# Patient Record
Sex: Female | Born: 1990
Health system: Southern US, Community
[De-identification: ages and names within clinical notes are randomized; demographics above are authoritative.]

## PROBLEM LIST (undated history)

## (undated) DIAGNOSIS — F329 Major depressive disorder, single episode, unspecified: Secondary | ICD-10-CM

## (undated) DIAGNOSIS — F32A Depression, unspecified: Secondary | ICD-10-CM

## (undated) DIAGNOSIS — L309 Dermatitis, unspecified: Secondary | ICD-10-CM

## (undated) HISTORY — DX: Depression, unspecified: F32.A

## (undated) HISTORY — DX: Dermatitis, unspecified: L30.9

---
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
## 2017-01-24 ENCOUNTER — Ambulatory Visit (INDEPENDENT_AMBULATORY_CARE_PROVIDER_SITE_OTHER): Payer: BLUE CROSS/BLUE SHIELD | Admitting: Family Medicine

## 2017-01-24 VITALS — BP 112/64 | HR 60 | Temp 98.6°F | Resp 16 | Ht 68.5 in | Wt 124.0 lb

## 2017-01-24 DIAGNOSIS — Z30015 Encounter for initial prescription of vaginal ring hormonal contraceptive: Secondary | ICD-10-CM | POA: Diagnosis not present

## 2017-01-24 DIAGNOSIS — Z124 Encounter for screening for malignant neoplasm of cervix: Secondary | ICD-10-CM | POA: Diagnosis not present

## 2017-01-24 DIAGNOSIS — Z113 Encounter for screening for infections with a predominantly sexual mode of transmission: Secondary | ICD-10-CM | POA: Diagnosis not present

## 2017-01-24 DIAGNOSIS — M799 Soft tissue disorder, unspecified: Secondary | ICD-10-CM

## 2017-01-24 DIAGNOSIS — Z0000 Encounter for general adult medical examination without abnormal findings: Secondary | ICD-10-CM

## 2017-01-24 DIAGNOSIS — M7989 Other specified soft tissue disorders: Secondary | ICD-10-CM

## 2017-01-24 LAB — POCT URINE PREGNANCY: PREG TEST UR: NEGATIVE

## 2017-01-24 MED ORDER — ETONOGESTREL-ETHINYL ESTRADIOL 0.12-0.015 MG/24HR VA RING: VAGINAL_RING | VAGINAL | 12 refills | Status: DC

## 2017-01-24 NOTE — Progress Notes (Signed)
Chief Complaint  Patient presents with  . Annual Exam    w/PAP    Subjective:  Pamela Gallegos is a 26 y.o. femalRichmond Campbelle here for a health maintenance visit.  Patient is new pt  Contraception: Pt uses condoms States that she has sex on January 13, 2017 She took a plan B pill within 24 hours Took an iud in the past and had uterine pain Took pills inconsistently  There are no active problems to display for this patient.   Past Medical History:  Diagnosis Date  . Eczema     No past surgical history on file.   No outpatient prescriptions prior to visit.   No facility-administered medications prior to visit.     Allergies  Allergen Reactions  . Penicillins      Family History  Problem Relation Age of Onset  . Hyperlipidemia Mother   . Hypertension Mother      Health Habits: Dental Exam: not up to date Eye Exam: not up to date Exercise: 7 times/week on average Current exercise activities: walking/running Diet: balanced  Social History   Social History  . Marital status: Single    Spouse name: N/A  . Number of children: N/A  . Years of education: N/A   Occupational History  . Not on file.   Social History Main Topics  . Smoking status: Never Smoker  . Smokeless tobacco: Never Used  . Alcohol use Not on file  . Drug use: Unknown  . Sexual activity: Not on file   Other Topics Concern  . Not on file   Social History Narrative  . No narrative on file   History  Alcohol use Not on file   History  Smoking Status  . Never Smoker  Smokeless Tobacco  . Never Used   History  Drug use: Unknown    GYN: Sexual Health Menstrual status: regular menses LMP: Patient's last menstrual period was 12/28/2016 (exact date). Last pap smear: see HM section History of abnormal pap smears:  Sexually active: yes with female partner Current contraception: condoms and withdrawal method  Health Maintenance: See under health Maintenance activity for review of  completion dates as well.  There is no immunization history on file for this patient.    Depression Screen-PHQ2/9 [facility] screen [facility] 01/[facility]/2017  Decreased Interest 0  Down, Depressed, Hopeless 0  PHQ - 2 Score 0      Depression Severity and Treatment Recommendations:  0-4= None  5-9= Mild / Treatment: Support, educate to call if worse; return in one month  10-14= Moderate / Treatment: Support, watchful waiting; Antidepressant or Psycotherapy  15-19= Moderately severe / Treatment: Antidepressant OR Psychotherapy  >= 20 = Major depression, severe / Antidepressant AND Psychotherapy    Review of Systems   Review of Systems  Constitutional: Negative for chills, fever and weight loss.  HENT: Negative for congestion, hearing loss and sinus pain.   Eyes: Negative for blurred vision and double vision.  Respiratory: Negative for cough, shortness of breath and wheezing.   Cardiovascular: Negative for chest pain, palpitations and leg swelling.  Gastrointestinal: Negative for abdominal pain, diarrhea, nausea and vomiting.  Genitourinary: Negative for dysuria, frequency and urgency.  Musculoskeletal: Negative for back pain, joint pain and myalgias.  Skin: Negative for itching and rash.  Neurological: Negative for dizziness, tingling, tremors, sensory change, speech change and headaches.  Psychiatric/Behavioral: Negative for depression. The patient is not nervous/anxious and does not have insomnia.     See HPI for  ROS as well.    Objective:   Vitals:   01/24/17 0857  BP: 112/64  Pulse: 60  Resp: 16  Temp: 98.6 F (37 C)  TempSrc: Oral  SpO2: 100%  Weight: 124 lb (56.2 kg)  Height: 5' 8.5" (1.74 m)    Body mass index is 18.58 kg/m.  Physical Exam  Constitutional: She is oriented to person, place, and time. She appears well-developed and well-nourished.  HENT:  Head: Normocephalic and atraumatic.  Right Ear: External ear normal.  Left Ear: External ear normal.   Nose: Nose normal.  Mouth/Throat: Oropharynx is clear and moist.  Eyes: Conjunctivae and EOM are normal. Pupils are equal, round, and reactive to light.  Neck: Normal range of motion. Neck supple.  Cardiovascular: Normal rate, regular rhythm, normal heart sounds and intact distal pulses.   No murmur heard. Pulmonary/Chest: Effort normal and breath sounds normal. No respiratory distress. She has no wheezes.  Abdominal: Soft. Bowel sounds are normal. She exhibits no distension. There is no tenderness.  Genitourinary: There is no tenderness or lesion on the right labia. There is no tenderness or lesion on the left labia. No erythema, tenderness or bleeding in the vagina.  Genitourinary Comments: Mons pubis showing a small lesion with purulent drainage  Musculoskeletal: Normal range of motion. She exhibits no edema.  Neurological: She is alert and oriented to person, place, and time. No cranial nerve deficit.  Skin: Skin is warm. No erythema.  Psychiatric: She has a normal mood and affect. Her behavior is normal. Judgment and thought content normal.     Assessment/Plan:   Patient was seen for a health maintenance exam.  Counseled the patient on health maintenance issues. Reviewed her health mainteance schedule and ordered appropriate tests (see orders.) Counseled on regular exercise and weight management. Recommend regular eye exams and dental cleaning.   The following issues were addressed today for health maintenance:   Pamela Gallegos was seen today for annual exam.  Diagnoses and all orders for this visit:  Health maintenance examination- discussed age appropriate screenings  Screen for STD (sexually transmitted disease)- discussed screenings -     Pap IG, CT/NG w/ reflex HPV when ASC-U -     RPR -     HIV antibody -     Hepatitis B surface antibody -     Herpes simplex virus culture  Pap smear for cervical cancer screening- patient's first pap Discussed screening for cervical  cancer and high risk HPV -     Pap IG, CT/NG w/ reflex HPV when ASC-U  Encounter for initial prescription of vaginal ring hormonal contraceptive -     POCT urine pregnancy -     etonogestrel-ethinyl estradiol (NUVARING) 0.12-0.015 MG/24HR vaginal ring; Insert vaginally and leave in place for 3 consecutive weeks, then remove for 1 week.  Soft tissue lesion of pelvic region- will check for hsv -     Herpes simplex virus culture  Other orders    No Follow-up on file.    Body mass index is 18.58 kg/m.:  Discussed the patient's BMI with patient. The BMI body mass index is 18.58 kg/m.     No future appointments.  There are no Patient Instructions on file for this visit.

## 2017-01-25 LAB — HIV ANTIBODY (ROUTINE TESTING W REFLEX): HIV SCREEN 4TH GENERATION: NONREACTIVE

## 2017-01-25 LAB — RPR: RPR Ser Ql: NONREACTIVE

## 2017-01-25 LAB — HEPATITIS B SURFACE ANTIBODY, QUANTITATIVE: HEPATITIS B SURF AB QUANT: 404.8 m[IU]/mL

## 2017-01-26 LAB — HERPES SIMPLEX VIRUS CULTURE

## 2017-01-27 LAB — PAP IG, CT-NG, RFX HPV ASCU
CHLAMYDIA, NUC. ACID AMP: NEGATIVE
GONOCOCCUS BY NUCLEIC ACID AMP: NEGATIVE
PAP Smear Comment: 0

## 2017-02-19 ENCOUNTER — Ambulatory Visit (INDEPENDENT_AMBULATORY_CARE_PROVIDER_SITE_OTHER): Payer: BLUE CROSS/BLUE SHIELD | Admitting: Urgent Care

## 2017-02-19 ENCOUNTER — Ambulatory Visit (INDEPENDENT_AMBULATORY_CARE_PROVIDER_SITE_OTHER): Payer: BLUE CROSS/BLUE SHIELD

## 2017-02-19 VITALS — BP 100/63 | HR 61 | Temp 98.5°F | Resp 17 | Ht 68.5 in | Wt 128.0 lb

## 2017-02-19 DIAGNOSIS — M79641 Pain in right hand: Secondary | ICD-10-CM

## 2017-02-19 DIAGNOSIS — M25531 Pain in right wrist: Secondary | ICD-10-CM | POA: Diagnosis not present

## 2017-02-19 MED ORDER — NAPROXEN SODIUM 550 MG PO TABS: 550.0000 mg | ORAL_TABLET | Freq: Two times a day (BID) | ORAL | 1 refills | Status: DC

## 2017-02-19 NOTE — Patient Instructions (Addendum)
Wrist Pain, Adult There are many things that can cause wrist pain. Some common causes include:  An injury to the wrist area, such as a sprain, strain, or fracture.  Overuse of the joint.  A condition that causes increased pressure on a nerve in the wrist (carpal tunnel syndrome).  Wear and tear of the joints that occurs with aging (osteoarthritis).  A variety of other types of arthritis. Sometimes, the cause of wrist pain is not known. Often, the pain goes away when you follow instructions from your health care provider for relieving pain at home, such as resting or icing the wrist. If your wrist pain continues, it is important to tell your health care provider. Follow these instructions at home:  Rest the wrist area for at least 48 hours or as long as told by your health care provider.  If a splint or elastic bandage has been applied, use it as told by your health care provider.  Remove the splint or bandage only as told by your health care provider.  Loosen the splint or bandage if your fingers tingle, become numb, or turn cold or blue.  If directed, apply ice to the injured area.  If you have a removable splint or elastic bandage, remove it as told by your health care provider.  Put ice in a plastic bag.  Place a towel between your skin and the bag or between your splint or bandage and the bag.  Leave the ice on for 20 minutes, 2-3 times a day.  Keep your arm raised (elevated) above the level of your heart while you are sitting or lying down.  Take over-the-counter and prescription medicines only as told by your health care provider.  Keep all follow-up visits as told by your health care provider. This is important. Contact a health care provider if:  You have a sudden sharp pain in the wrist, hand, or arm that is different or new.  The swelling or bruising on your wrist or hand gets worse.  Your skin becomes red, gets a rash, or has open sores.  Your pain does not  get better or it gets worse. Get help right away if:  You lose feeling in your fingers or hand.  Your fingers turn white, very red, or cold and blue.  You cannot move your fingers.  You have a fever or chills. This information is not intended to replace advice given to you by your health care provider. Make sure you discuss any questions you have with your health care provider. Document Released: 09/11/2005 Document Revised: 06/27/2016 Document Reviewed: 06/20/2016 Elsevier Interactive Patient Education  2017 [employer]     IF you received an x-ray today, you will receive an invoice from [facility]. Please contact [facility] at [phone] with questions or concerns regarding your invoice.   IF you received labwork today, you will receive an invoice from [facility]. Please contact [facility] at [phone] with questions or concerns regarding your invoice.   Our billing staff will not be able to assist you with questions regarding bills from these companies.  You will be contacted with the lab results as soon as they are available. The fastest way to get your results is to activate your My Chart account. Instructions are located on the last page of this paperwork. If you have not heard from us regarding the results in 2 weeks, please contact this office.

## 2017-02-19 NOTE — Progress Notes (Signed)
MRN: [id number] DOB: [birth year]  Subjective:   Pamela Gallegos is a 26 y.o. female presenting for chief complaint of Wrist Pain  Reports 3 day history of right wrist and hand pain. Pain is over ventral aspect of wrist, dorsal aspect of hand with associated numbness or tingling. She also admits pain at knuckles and swelling of her fingers. 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 Denies trauma, weakness, decreased ROM. Denies personal history of RA.  Lilly has a current medication list which includes the following prescription(s): etonogestrel-ethinyl estradiol, ibuprofen, and triamcinolone. Also is allergic to penicillins. Aylana  has a past medical history of Eczema. Also denies past surgical history.  Objective:   Vitals: BP 100/63   Pulse 61   Temp 98.5 F (36.9 C) (Oral)   Resp 17   Ht 5' 8.5" (1.74 m)   Wt 128 lb (58.1 kg)   LMP 01/31/2017 (Approximate)   SpO2 97%   BMI 19.18 kg/m   Physical Exam  Constitutional: She is oriented to person, place, and time. She appears well-developed and well-nourished.  Cardiovascular: Normal rate.   Pulmonary/Chest: Effort normal.  Musculoskeletal:       Right wrist: She exhibits normal range of motion, no tenderness, no bony tenderness, no swelling, no effusion, no crepitus, no deformity and no laceration.       Right hand: She exhibits swelling (trace over 2nd-3rd fingers). She exhibits normal range of motion, no tenderness, no bony tenderness, normal two-point discrimination, normal capillary refill, no deformity and no laceration. Normal sensation noted. Normal strength noted.  Neurological: She is alert and oriented to person, place, and time.  Skin: Skin is warm and dry.   No results found.  Assessment and Plan :   1. Right wrist pain 2. Right hand pain - Likely due to inflammatory process from overuse/nature of her work. Start  conservative management, start wearing wrist splint, use Anaprox. RTC in 2 weeks if no improvement. Follow up with labs.  Wallis Bamberg, PA-C Primary Care at [facility] [phone] 02/19/2017  5:43 PM

## 2017-02-20 ENCOUNTER — Telehealth: Payer: Self-pay | Admitting: Urgent Care

## 2017-02-20 LAB — SEDIMENTATION RATE: SED RATE: 5 mm/h (ref 0–32)

## 2017-02-20 LAB — CBC
Hematocrit: 37.6 % (ref 34.0–46.6)
Hemoglobin: 12.5 g/dL (ref 11.1–15.9)
MCH: 31.3 pg (ref 26.6–33.0)
MCHC: 33.2 g/dL (ref 31.5–35.7)
MCV: 94 fL (ref 79–97)
PLATELETS: 394 10*3/uL — AB (ref 150–379)
RBC: 3.99 x10E6/uL (ref 3.77–5.28)
RDW: 14.1 % (ref 12.3–15.4)
WBC: 3.8 10*3/uL (ref 3.4–10.8)

## 2017-02-20 NOTE — Telephone Encounter (Signed)
Pt was seen yesterday for wrist pain. Labs and x-rays were done and pt says results came back normal. She was wondering if she should still take the naproxen sodium that was prescribed to her. Please advise. Best callback number is [phone]. Pt said it is okay to leave a voicemail.

## 2017-02-20 NOTE — Telephone Encounter (Signed)
She may continue to take, correct?

## 2017-02-20 NOTE — Telephone Encounter (Signed)
Yes, she should take these medications. Please also let her know that her blood cell counts were normal and that her marker for inflammation was also very normal. Thank you!

## 2017-02-20 NOTE — Telephone Encounter (Signed)
Pt advised.

## 2017-02-21 ENCOUNTER — Telehealth: Payer: Self-pay | Admitting: Emergency Medicine

## 2017-02-21 NOTE — Telephone Encounter (Signed)
Spoke with pt gave her results of Xray pt understand results will f/u in no better

## 2017-02-21 NOTE — Telephone Encounter (Signed)
-----   Message from Pamela BambergMario Mani, [place]-C sent at 02/20/2017  8:17 AM EST ----- Please report to patient that her x-rays were normal. Her wrist and hand pain is likely due to overuse like we discussed in clinic. She is to follow our recommendations, take medication that we prescribed and return to clinic in 2 weeks if no improvement.

## 2017-03-05 ENCOUNTER — Encounter: Payer: Self-pay | Admitting: Family Medicine

## 2017-06-11 ENCOUNTER — Encounter: Payer: Self-pay | Admitting: Family Medicine

## 2017-06-11 ENCOUNTER — Ambulatory Visit (INDEPENDENT_AMBULATORY_CARE_PROVIDER_SITE_OTHER): Payer: BLUE CROSS/BLUE SHIELD | Admitting: Family Medicine

## 2017-06-11 VITALS — BP 107/68 | HR 77 | Temp 98.2°F | Resp 16 | Ht 68.9 in | Wt 126.4 lb

## 2017-06-11 DIAGNOSIS — N898 Other specified noninflammatory disorders of vagina: Secondary | ICD-10-CM | POA: Diagnosis not present

## 2017-06-11 NOTE — Progress Notes (Signed)
  Chief Complaint  Patient presents with  . labia minora    small raised bump on labia, onset: 06/07/17, painful with wiping    HPI   Pt repoprts a small lesion on her right labia minora that she noticed a few days ago in the shower It was found incidentally but was painful when she touched it or wiped No bleeding She reports that it resolved without intervention  Past Medical History:  Diagnosis Date  . Eczema     Current Outpatient Prescriptions  Medication Sig Dispense Refill  . etonogestrel-ethinyl estradiol (NUVARING) 0.12-0.015 MG/24HR vaginal ring Insert vaginally and leave in place for 3 consecutive weeks, then remove for 1 week. 1 each 12  . Ibuprofen (ADVIL PO) Take by mouth.    . triamcinolone (KENALOG) 0.025 % cream Apply 1 application topically as needed.    . naproxen sodium (ANAPROX DS) 550 MG tablet Take 1 tablet (550 mg total) by mouth 2 (two) times daily with a meal. (Patient not taking: Reported on 06/11/2017) 30 tablet 1   No current facility-administered medications for this visit.     Allergies:  Allergies  Allergen Reactions  . Penicillins     History reviewed. No pertinent surgical history.  Social History   Social History  . Marital status: Single    Spouse name: N/A  . Number of children: N/A  . Years of education: N/A   Social History Main Topics  . Smoking status: Never Smoker  . Smokeless tobacco: Never Used  . Alcohol use No  . Drug use: No  . Sexual activity: No   Other Topics Concern  . None   Social History Narrative  . None    ROS No fevers or chills She hpi  Objective: Vitals:   06/11/17 1128  BP: 107/68  Pulse: 77  Resp: 16  Temp: 98.2 F (36.8 C)  TempSrc: Oral  SpO2: 99%  Weight: 126 lb 6.4 oz (57.3 kg)  Height: 5' 8.9" (1.75 m)    Physical Exam  Constitutional: She is oriented to person, place, and time. She appears well-developed and well-nourished.  HENT:  Head: Normocephalic and atraumatic.    Neurological: She is alert and oriented to person, place, and time.   Vaginal exam Chaperone present Labia normal bilaterally without skin lesions Urethral meatus normal appearing without erythema     Assessment and Plan Zamaria was seen today for labia minora.  Diagnoses and all orders for this visit:  Vaginal lesion  Resolved Discussed different types of lesions Based on location it was probably a blocked duct that formed a small cyst   Jamiria Langill A Creta LevinStallings

## 2017-06-11 NOTE — Patient Instructions (Addendum)
IF you received an x-ray today, you will receive an invoice from [facility]. Please contact [facility] at [phone] with questions or concerns regarding your invoice.   IF you received labwork today, you will receive an invoice from [facility]. Please contact [facility] at [phone] with questions or concerns regarding your invoice.   Our billing staff will not be able to assist you with questions regarding bills from these companies.  You will be contacted with the lab results as soon as they are available. The fastest way to get your results is to activate your My Chart account. Instructions are located on the last page of this paperwork. If you have not heard from us regarding the results in 2 weeks, please contact this office.     Bartholin Cyst or Abscess A Bartholin cyst is a fluid-filled sac that forms on a Bartholin gland. Bartholin glands are small glands that are located within the folds of skin (labia) along the sides of the lower opening of the vagina. These glands produce a fluid to moisten the outside of the vagina during sexual intercourse. A Bartholin cyst causes a bulge on the side of the vagina. A cyst that is not large or infected may not cause symptoms or problems. However, if the fluid within the cyst becomes infected, the cyst can turn into an abscess. An abscess may cause discomfort or pain. What are the causes? A Bartholin cyst may develop when the duct of the gland becomes blocked. In many cases, the cause of this is not known. Various kinds of bacteria can cause the cyst to become infected and develop into an abscess. What increases the risk? You may be at an increased risk of developing a Bartholin cyst or abscess if:  You are a woman of reproductive age.  You have a history of previous Bartholin cysts or abscesses.  You have diabetes.  You have a sexually transmitted disease (STD).  What are the signs or symptoms? The severity of  symptoms varies depending on the size of the cyst and whether it is infected. Symptoms may include:  A bulge or swelling near the lower opening of your vagina.  Discomfort or pain.  Redness.  Pain during sexual intercourse.  Pain when walking.  Fluid draining from the area.  How is this diagnosed? Your health care provider may make a diagnosis based on your symptoms and a physical exam. He or she will look for swelling in your vaginal area. Blood tests may be done to check for infections. A sample of fluid from the cyst or abscess may also be taken to be tested in a lab. How is this treated? Small cysts that are not infected may not require any treatment. These often go away on their own. Yourhealth care provider will recommend hot baths and the use of warm compresses. These may also be part of the treatment for an abscess. Treatment options for a large cyst or abscess may include:  Antibiotic medicine.  A surgical procedure to drain the abscess. One of the following procedures may be done: ? Incision and drainage. An incision is made in the cyst or abscess so that the fluid drains out. A catheter may be placed inside the cyst so that it does not close and fill up with fluid again. The catheter will be removed after you have a follow-up visit with a specialist (gynecologist). ? Marsupialization. The cyst or abscess is opened and kept open by stitching the edges of the  skin to the walls of the cyst or abscess. This allows it to continue to drain and not fill up with fluid again.  If you have cysts or abscesses that keep returning and have required incision and drainage multiple times, your health care provider may talk to you about surgery to remove the Bartholin gland. Follow these instructions at home:  Take medicines only as directed by your health care provider.  If you were prescribed an antibiotic medicine, finish it all even if you start to feel better.  Apply warm, wet  compresses to the area or take warm, shallow baths that cover your pelvic region (sitz baths) several times a day or as directed by your health care provider.  Do not squeeze the cyst or apply heavy pressure to it.  Do not have sexual intercourse until the cyst has gone away.  If your cyst or abscess was opened, a small piece of gauze or a drain may have been placed in the area to allow drainage. Do not remove the gauze or the drain until directed by your health care provider.  Wear feminine pads-not tampons-as needed for any drainage or bleeding.  Keep all follow-up visits as directed by your health care provider. This is important. How is this prevented? Take these steps to help prevent a Bartholin cyst from returning:  Practice good hygiene.  Clean your vaginal area with mild soap and a soft cloth when you bathe.  Practice safe sex to prevent STDs.  Contact a health care provider if:  You have increased pain, swelling, or redness in the area of the cyst.  Puslike drainage is coming from the cyst.  You have a fever. This information is not intended to replace advice given to you by your health care provider. Make sure you discuss any questions you have with your health care provider. Document Released: 12/02/2005 Document Revised: 05/09/2016 Document Reviewed: 07/18/2014 Elsevier Interactive Patient Education  2018 [employer].

## 2017-09-06 ENCOUNTER — Ambulatory Visit (INDEPENDENT_AMBULATORY_CARE_PROVIDER_SITE_OTHER): Payer: BLUE CROSS/BLUE SHIELD | Admitting: Family Medicine

## 2017-09-06 VITALS — BP 107/67 | HR 56 | Temp 98.3°F | Resp 16 | Ht 69.0 in | Wt 133.2 lb

## 2017-09-06 DIAGNOSIS — M545 Low back pain, unspecified: Secondary | ICD-10-CM

## 2017-09-06 MED ORDER — IBUPROFEN 600 MG PO TABS: 600.0000 mg | ORAL_TABLET | Freq: Three times a day (TID) | ORAL | 0 refills | Status: AC | PRN

## 2017-09-06 NOTE — Progress Notes (Signed)
  Chief Complaint  Patient presents with  . Back Pain    HPI  Pt report that she started having more back pain since using the nuvaring During her menstrual cycle she has been having in creasing back pain that resolves She states that her lower back since last 3 weeks has shooting pains She states that since using the nuvaring she would wake up to pain 2-3 times She stopped the nuvaring since mid-may The pain has been present for 5- 6 months  Past Medical History:  Diagnosis Date  . Eczema     Current Outpatient Prescriptions  Medication Sig Dispense Refill  . ibuprofen (ADVIL,MOTRIN) 600 MG tablet Take 1 tablet (600 mg total) by mouth every 8 (eight) hours as needed. 30 tablet 0   No current facility-administered medications for this visit.     Allergies:  Allergies  Allergen Reactions  . Penicillins     No past surgical history on file.  Social History   Social History  . Marital status: Single    Spouse name: N/A  . Number of children: N/A  . Years of education: N/A   Social History Main Topics  . Smoking status: Never Smoker  . Smokeless tobacco: Never Used  . Alcohol use No  . Drug use: No  . Sexual activity: No   Other Topics Concern  . Not on file   Social History Narrative  . No narrative on file    ROS  Objective: Vitals:   09/06/17 1132  BP: 107/67  Pulse: (!) 56  Resp: 16  Temp: 98.3 F (36.8 C)  TempSrc: Oral  SpO2: 98%  Weight: 133 lb 3.2 oz (60.4 kg)  Height:  (1.753 m)    Physical Exam  Constitutional: She is oriented to person, place, and time. She appears well-developed and well-nourished.  HENT:  Head: Normocephalic and atraumatic.  Eyes: Conjunctivae and EOM are normal.  Cardiovascular: Normal rate, regular rhythm and normal heart sounds.   No murmur heard. Pulmonary/Chest: Effort normal and breath sounds normal. No respiratory distress. She has no wheezes.  Musculoskeletal:       Back:  Neurological: She is  alert and oriented to person, place, and time.  Psychiatric: She has a normal mood and affect. Her behavior is normal. Judgment and thought content normal.    Assessment and Plan Olyvia was seen today for back pain.  Diagnoses and all orders for this visit:  Acute midline low back pain without sciatica -   Advised ibuprofen with topical aspercreme with lidocaine  Other orders -     ibuprofen (ADVIL,MOTRIN) 600 MG tablet; Take 1 tablet (600 mg total) by mouth every 8 (eight) hours as needed.     Rishaan Gunner A Redonna Wilbert

## 2017-09-06 NOTE — Patient Instructions (Addendum)
aspercreme with lidocaine apply to the low back  Check out youtube [employer] [facility] 11 best lower back stretches for pain and stiffness  IF you received an x-ray today, you will receive an invoice from [facility]. Please contact [facility] at [phone] with questions or concerns regarding your invoice.   IF you received labwork today, you will receive an invoice from [facility]. Please contact [facility] at [phone] with questions or concerns regarding your invoice.   Our billing staff will not be able to assist you with questions regarding bills from these companies.  You will be contacted with the lab results as soon as they are available. The fastest way to get your results is to activate your My Chart account. Instructions are located on the last page of this paperwork. If you have not heard from [place] regarding the results in 2 weeks, please contact this office.      Muscle Cramps and Spasms Muscle cramps and spasms occur when a muscle or muscles tighten and you have no control over this tightening (involuntary muscle contraction). They are a common problem and can develop in any muscle. The most common place is in the calf muscles of the leg. Muscle cramps and muscle spasms are both involuntary muscle contractions, but there are some differences between the two:  Muscle cramps are painful. They come and go and may last a few seconds to 15 minutes. Muscle cramps are often more forceful and last longer than muscle spasms.  Muscle spasms may or may not be painful. They may also last just a few seconds or much longer.  Certain medical conditions, such as diabetes or Parkinson disease, can make it more likely to develop cramps or spasms. However, cramps or spasms are usually not caused by a serious underlying problem. Common causes include:  Overexertion.  Overuse from repetitive motions, or doing the same thing over and over.  Remaining in a certain position for  a long period of time.  Improper preparation, form, or technique while playing a sport or doing an activity.  Dehydration.  Injury.  Side effects of some medicines.  Abnormally low levels of the salts and ions in your blood (electrolytes), especially potassium and calcium. This could happen if you are taking water pills (diuretics) or if you are pregnant.  In many cases, the cause of muscle cramps or spasms is unknown. Follow these instructions at home:  Stay well hydrated. Drink enough fluid to keep your urine clear or pale yellow.  Try massaging, stretching, and relaxing the affected muscle.  If directed, apply heat to tight or tense muscles as often as told by your health care provider. Use the heat source that your health care provider recommends, such as a moist heat pack or a heating pad. ? Place a towel between your skin and the heat source. ? Leave the heat on for 20-30 minutes. ? Remove the heat if your skin turns bright red. This is especially important if you are unable to feel pain, heat, or cold. You may have a greater risk of getting burned.  If directed, put ice on the affected area. This may help if you are sore or have pain after a cramp or spasm. ? Put ice in a plastic bag. ? Place a towel between your skin and the bag. ? Leavethe ice on for 20 minutes, 2-3 times a day.  Take over-the-counter and prescription medicines only as told by your health care provider.  Pay attention to any changes in  your symptoms. Contact a health care provider if:  Your cramps or spasms get more severe or happen more often.  Your cramps or spasms do not improve over time. This information is not intended to replace advice given to you by your health care provider. Make sure you discuss any questions you have with your health care provider. Document Released: 05/24/2002 Document Revised: 01/03/2016 Document Reviewed: 09/05/2015 Elsevier Interactive Patient Education  2018 [employer].

## 2017-09-13 ENCOUNTER — Ambulatory Visit (INDEPENDENT_AMBULATORY_CARE_PROVIDER_SITE_OTHER): Payer: BLUE CROSS/BLUE SHIELD | Admitting: Urgent Care

## 2017-09-13 VITALS — BP 106/60 | HR 94 | Temp 99.0°F | Resp 17 | Ht 69.0 in | Wt 131.8 lb

## 2017-09-13 DIAGNOSIS — B9789 Other viral agents as the cause of diseases classified elsewhere: Secondary | ICD-10-CM

## 2017-09-13 DIAGNOSIS — J069 Acute upper respiratory infection, unspecified: Secondary | ICD-10-CM

## 2017-09-13 DIAGNOSIS — R0981 Nasal congestion: Secondary | ICD-10-CM

## 2017-09-13 MED ORDER — BENZONATATE 100 MG PO CAPS: 100.0000 mg | ORAL_CAPSULE | Freq: Three times a day (TID) | ORAL | 0 refills | Status: DC | PRN

## 2017-09-13 MED ORDER — HYDROCODONE-HOMATROPINE 5-1.5 MG/5ML PO SYRP: 5.0000 mL | ORAL_SOLUTION | Freq: Every evening | ORAL | 0 refills | Status: DC | PRN

## 2017-09-13 MED ORDER — PSEUDOEPHEDRINE HCL ER 120 MG PO TB12: 120.0000 mg | ORAL_TABLET | Freq: Two times a day (BID) | ORAL | 3 refills | Status: DC

## 2017-09-13 NOTE — Progress Notes (Signed)
   MRN: [id number] DOB: 04/10/1991  Subjective:   Pamela Gallegos is a 26 y.o. female presenting for chief complaint of Cough (onset: monday 09/08/17 , clear drainage from cough, yellow and green drainage from nose, not taking any otc meds for symptoms, no fevers noticed) and Nasal Congestion  Reports 6 day history of productive cough without hemoptysis, nasal congestion, runny nose. Has not tried medications for relief. Denies fever, sinus pain, ear pain, ear drainage, sore throat (had this initially but is now resolved), chest pain, shob, wheezing, n/v, abdominal pain, rashes. Denies smoking cigarettes.  Pamela Gallegos has a current medication list which includes the following prescription(s): ibuprofen. Also is allergic to penicillins.  Pamela Gallegos  has a past medical history of Eczema. Denies past surgical history.  Objective:   Vitals: BP 106/60 (BP Location: Right Arm, Patient Position: Sitting, Cuff Size: Normal)   Pulse 94   Temp 99 F (37.2 C) (Oral)   Resp 17   Ht  (1.753 m)   Wt 131 lb 12.8 oz (59.8 kg)   LMP 08/18/2017   SpO2 98%   BMI 19.46 kg/m   Physical Exam  Constitutional: She is oriented to person, place, and time. She appears well-developed and well-nourished.  HENT:  TM's intact bilaterally, no effusions or erythema. Nasal turbinates pink and moist, nasal passages patent. No sinus tenderness. Oropharynx clear, mucous membranes moist.   Eyes: Right eye exhibits no discharge. Left eye exhibits no discharge.  Neck: Neck supple.  Cardiovascular: Normal rate, regular rhythm and intact distal pulses.  Exam reveals no gallop and no friction rub.   No murmur heard. Pulmonary/Chest: No respiratory distress. She has no wheezes. She has no rales.  Lymphadenopathy:    She has no cervical adenopathy.  Neurological: She is alert and oriented to person, place, and time.  Skin: Skin is warm and dry.  Psychiatric: She has a normal mood and affect.   Assessment and Plan :    1. Viral URI with cough 2. Nasal congestion - Likely viral in nature, advised supportive care. - If no improvement or symptoms do not resolve return to clinic in 1 week.   Wallis Bamberg, PA-C Primary Care at [facility] [phone] 09/13/2017  11:11 AM

## 2017-09-13 NOTE — Patient Instructions (Addendum)
For sore throat try using a honey-based tea. Use 3 teaspoons of honey with juice squeezed from half lemon. Place shaved pieces of ginger into 1/2-1 cup of water and warm over stove top. Then mix the ingredients and repeat every 4 hours as needed.     Upper Respiratory Infection, Adult Most upper respiratory infections (URIs) are a viral infection of the air passages leading to the lungs. A URI affects the nose, throat, and upper air passages. The most common type of URI is nasopharyngitis and is typically referred to as "the common cold." URIs run their course and usually go away on their own. Most of the time, a URI does not require medical attention, but sometimes a bacterial infection in the upper airways can follow a viral infection. This is called a secondary infection. Sinus and middle ear infections are common types of secondary upper respiratory infections. Bacterial pneumonia can also complicate a URI. A URI can worsen asthma and chronic obstructive pulmonary disease (COPD). Sometimes, these complications can require emergency medical care and may be life threatening. What are the causes? Almost all URIs are caused by viruses. A virus is a type of germ and can spread from one person to another. What increases the risk? You may be at risk for a URI if:  You smoke.  You have chronic heart or lung disease.  You have a weakened defense (immune) system.  You are very young or very old.  You have nasal allergies or asthma.  You work in crowded or poorly ventilated areas.  You work in health care facilities or schools.  What are the signs or symptoms? Symptoms typically develop 2-3 days after you come in contact with a cold virus. Most viral URIs last 7-10 days. However, viral URIs from the influenza virus (flu virus) can last 14-18 days and are typically more severe. Symptoms may include:  Runny or stuffy (congested) nose.  Sneezing.  Cough.  Sore  throat.  Headache.  Fatigue.  Fever.  Loss of appetite.  Pain in your forehead, behind your eyes, and over your cheekbones (sinus pain).  Muscle aches.  How is this diagnosed? Your health care provider may diagnose a URI by:  Physical exam.  Tests to check that your symptoms are not due to another condition such as: ? Strep throat. ? Sinusitis. ? Pneumonia. ? Asthma.  How is this treated? A URI goes away on its own with time. It cannot be cured with medicines, but medicines may be prescribed or recommended to relieve symptoms. Medicines may help:  Reduce your fever.  Reduce your cough.  Relieve nasal congestion.  Follow these instructions at home:  Take medicines only as directed by your health care provider.  Gargle warm saltwater or take cough drops to comfort your throat as directed by your health care provider.  Use a warm mist humidifier or inhale steam from a shower to increase air moisture. This may make it easier to breathe.  Drink enough fluid to keep your urine clear or pale yellow.  Eat soups and other clear broths and maintain good nutrition.  Rest as needed.  Return to work when your temperature has returned to normal or as your health care provider advises. You may need to stay home longer to avoid infecting others. You can also use a face mask and careful hand washing to prevent spread of the virus.  Increase the usage of your inhaler if you have asthma.  Do not use any tobacco products, including   cigarettes, chewing tobacco, or electronic cigarettes. If you need help quitting, ask your health care provider. How is this prevented? The best way to protect yourself from getting a cold is to practice good hygiene.  Avoid oral or hand contact with people with cold symptoms.  Wash your hands often if contact occurs.  There is no clear evidence that vitamin C, vitamin E, echinacea, or exercise reduces the chance of developing a cold. However, it is  always recommended to get plenty of rest, exercise, and practice good nutrition. Contact a health care provider if:  You are getting worse rather than better.  Your symptoms are not controlled by medicine.  You have chills.  You have worsening shortness of breath.  You have brown or red mucus.  You have yellow or brown nasal discharge.  You have pain in your face, especially when you bend forward.  You have a fever.  You have swollen neck glands.  You have pain while swallowing.  You have white areas in the back of your throat. Get help right away if:  You have severe or persistent: ? Headache. ? Ear pain. ? Sinus pain. ? Chest pain.  You have chronic lung disease and any of the following: ? Wheezing. ? Prolonged cough. ? Coughing up blood. ? A change in your usual mucus.  You have a stiff neck.  You have changes in your: ? Vision. ? Hearing. ? Thinking. ? Mood. This information is not intended to replace advice given to you by your health care provider. Make sure you discuss any questions you have with your health care provider. Document Released: 05/28/2001 Document Revised: 08/04/2016 Document Reviewed: 03/09/2014 Elsevier Interactive Patient Education  2017 Elsevier Inc.     IF you received an x-ray today, you will receive an invoice from [facility]. Please contact [facility] at [phone] with questions or concerns regarding your invoice.   IF you received labwork today, you will receive an invoice from [facility]. Please contact [facility] at [phone] with questions or concerns regarding your invoice.   Our billing staff will not be able to assist you with questions regarding bills from these companies.  You will be contacted with the lab results as soon as they are available. The fastest way to get your results is to activate your My Chart account. Instructions are located on the last page of this paperwork. If you have not heard  from us regarding the results in 2 weeks, please contact this office.      

## 2017-09-19 ENCOUNTER — Encounter: Payer: Self-pay | Admitting: Family Medicine

## 2017-11-12 ENCOUNTER — Ambulatory Visit (INDEPENDENT_AMBULATORY_CARE_PROVIDER_SITE_OTHER): Payer: BLUE CROSS/BLUE SHIELD | Admitting: Physician Assistant

## 2017-11-12 ENCOUNTER — Other Ambulatory Visit: Payer: Self-pay

## 2017-11-12 ENCOUNTER — Encounter: Payer: Self-pay | Admitting: Physician Assistant

## 2017-11-12 VITALS — BP 108/62 | HR 77 | Temp 98.5°F | Resp 18 | Ht 69.0 in | Wt 132.2 lb

## 2017-11-12 DIAGNOSIS — L089 Local infection of the skin and subcutaneous tissue, unspecified: Secondary | ICD-10-CM

## 2017-11-12 MED ORDER — SULFAMETHOXAZOLE-TRIMETHOPRIM 800-160 MG PO TABS: 1.0000 | ORAL_TABLET | Freq: Two times a day (BID) | ORAL | 0 refills | Status: AC

## 2017-11-12 NOTE — Patient Instructions (Addendum)
Please soak the finger at least 4 times per day for 15 minutes.  This can be regular clean water, soapy water, or water with 1/3 of hydrogen peroxide. You can take ibuprofen or tylenol for pain. Do not submerge your finger in still dirty waters. Please wear a finger glove with handling foods.   Take antibiotic as prescribed.   Follow these instructions at home:  Soak the fingers or toes in warm water as told by your doctor. You may be told to do this for 20 minutes, 2-3 times a day.  Keep the area dry when you are not soaking it.  Take medicines only as told by your doctor.  If you were given an antibiotic medicine, finish all of it even if you start to feel better.  Keep the affected area clean.  Do not try to drain a fluid-filled bump yourself.  Wear rubber gloves when putting your hands in water.  Wear gloves if your hands might touch cleaners or chemicals.  Follow your doctor's instructions about: ? Wound care. ? Bandage (dressing) changes and removal. Contact a doctor if:  Your symptoms get worse or do not improve.  You have a fever or chills.  You have redness spreading from the affected area.  You have more fluid, blood, or pus coming from the affected area.  Your finger or knuckle is swollen or is hard to move. This information is not intended to replace advice given to you by your health care provider. Make sure you discuss any questions you have with your health care provider. Document Released: 11/20/2009 Document Revised: 05/09/2016 Document Reviewed: 11/09/2014 Elsevier Interactive Patient Education  2018 [employer]      IF you received an x-ray today, you will receive an invoice from [facility]. Please contact [facility] at [phone] with questions or concerns regarding your invoice.   IF you received labwork today, you will receive an invoice from [facility]. Please contact [facility] at [phone] with questions or concerns  regarding your invoice.   Our billing staff will not be able to assist you with questions regarding bills from these companies.  You will be contacted with the lab results as soon as they are available. The fastest way to get your results is to activate your My Chart account. Instructions are located on the last page of this paperwork. If you have not heard from us regarding the results in 2 weeks, please contact this office.

## 2017-11-13 NOTE — Progress Notes (Signed)
[facility] [address], [place] [phone]  Date:  11/12/2017   Name:  Pamela Gallegos   DOB:  11-05-1991   MRN:  [id number]  PCP:  Doristine BosworthStallings, Zoe A, MD    History of Present Illness:  Pamela Gallegos is a 26 y.o. female patient who presents to PCP with  Chief Complaint  Patient presents with  . Hand Pain    right index finger has green stuff under nail pt states it doesnt hurt x3days     Patient has had right index finger pain for the last 3 days.  Today the pain has slightly eased off. She noted that she recently had acrylic like nail material removed at the nail salon.  This was recently after she accidentally hit her nail along a hard surface, cracking the the nail.  She noted that there was no bleeding.   No fever or red streaking.   Warmth to the finger was noticed.   There are no active problems to display for this patient.   Past Medical History:  Diagnosis Date  . Eczema     No past surgical history on file.  Social History   Tobacco Use  . Smoking status: Never Smoker  . Smokeless tobacco: Never Used  Substance Use Topics  . Alcohol use: No  . Drug use: No    Family History  Problem Relation Age of Onset  . Hyperlipidemia Mother   . Hypertension Mother     Allergies  Allergen Reactions  . Penicillins     Medication list has been reviewed and updated.  Current Outpatient Medications on File Prior to Visit  Medication Sig Dispense Refill  . ibuprofen (ADVIL,MOTRIN) 600 MG tablet Take 1 tablet (600 mg total) by mouth every 8 (eight) hours as needed. 30 tablet 0  . benzonatate (TESSALON) 100 MG capsule Take 1-2 capsules (100-200 mg total) by mouth 3 (three) times daily as needed. (Patient not taking: Reported on 11/12/2017) 60 capsule 0  . HYDROcodone-homatropine (HYCODAN) 5-1.5 MG/5ML syrup Take 5 mLs by mouth at bedtime as needed. (Patient not taking: Reported on 11/12/2017) 100 mL 0  . pseudoephedrine (SUDAFED 12  HOUR) 120 MG 12 hr tablet Take 1 tablet (120 mg total) by mouth 2 (two) times daily. (Patient not taking: Reported on 11/12/2017) 30 tablet 3   No current facility-administered medications on file prior to visit.     ROS ROS otherwise unremarkable unless listed above.  Physical Examination: BP 108/62   Pulse 77   Temp 98.5 F (36.9 C) (Oral)   Resp 18   Ht 5\' 9"  (1.753 m)   Wt 132 lb 3.2 oz (60 kg)   LMP 10/18/2017   SpO2 97%   BMI 19.52 kg/m  Ideal Body Weight: Weight in (lb) to have BMI = 25: 168.9  Physical Exam  Skin:  Right index finger with mildly erythematous distal digit along the palmar aspect.  Joint rom intact.  There is a whitish yellow area about .4cm in length at the center of nail placed more distally.  Mildly tender.     Procedure: verbal consent obtained.  Cleansed with povidine swabs.  23 gauge needle utilized to gently twist creating a whole at the nail.  Purulent drainage expressed.  This was done twice to allow a wider entryway of drainage.  More purulent fluid then expressed.  Culture obtained.  Cleansed with alcohol.  Dressings placed.  Assessment and Plan: Pamela Gallegos is  a 26 y.o. female who is here today for cc of  Chief Complaint  Patient presents with  . Hand Pain    right index finger has green stuff under nail pt states it doesnt hurt x3days   Advised warm soaks at least 4 times per day, and coverage.  Note for work, and wound care discussed with the patient. Started on bactrim.  Advised to return for alarming symptoms discussed.  Infection of finger - Plan: WOUND CULTURE, CANCELED: WOUND CULTURE  Trena PlattStephanie English, PA-C [facility] 12/4/20188:31 AM

## 2017-11-16 LAB — WOUND CULTURE: Organism ID, Bacteria: NONE SEEN

## 2017-11-18 ENCOUNTER — Encounter: Payer: Self-pay | Admitting: Physician Assistant

## 2018-02-18 ENCOUNTER — Ambulatory Visit: Payer: BLUE CROSS/BLUE SHIELD | Admitting: Family Medicine

## 2018-02-18 ENCOUNTER — Encounter: Payer: Self-pay | Admitting: Family Medicine

## 2018-02-18 ENCOUNTER — Ambulatory Visit (INDEPENDENT_AMBULATORY_CARE_PROVIDER_SITE_OTHER): Payer: BLUE CROSS/BLUE SHIELD

## 2018-02-18 ENCOUNTER — Other Ambulatory Visit: Payer: Self-pay

## 2018-02-18 VITALS — BP 94/62 | HR 98 | Temp 98.5°F | Resp 16 | Ht 69.0 in | Wt 128.4 lb

## 2018-02-18 DIAGNOSIS — M62830 Muscle spasm of back: Secondary | ICD-10-CM | POA: Diagnosis not present

## 2018-02-18 DIAGNOSIS — Z23 Encounter for immunization: Secondary | ICD-10-CM | POA: Diagnosis not present

## 2018-02-18 DIAGNOSIS — M545 Low back pain: Secondary | ICD-10-CM

## 2018-02-18 DIAGNOSIS — G8929 Other chronic pain: Secondary | ICD-10-CM | POA: Diagnosis not present

## 2018-02-18 LAB — POCT URINALYSIS DIP (MANUAL ENTRY)
Bilirubin, UA: NEGATIVE
Glucose, UA: NEGATIVE mg/dL
Ketones, POC UA: NEGATIVE mg/dL
LEUKOCYTES UA: NEGATIVE
Nitrite, UA: NEGATIVE
PROTEIN UA: NEGATIVE mg/dL
SPEC GRAV UA: 1.025 (ref 1.010–1.025)
UROBILINOGEN UA: 0.2 U/dL
pH, UA: 6.5 (ref 5.0–8.0)

## 2018-02-18 NOTE — Progress Notes (Signed)
Chief Complaint  Patient presents with  . Back Pain    onset: May 2018 after having the nuvaring insertion,  pain and stiffness in back, pain when she gets up out of bed.  Pain level now 1/10  with movement 7/10.  No otc meds just stretches but pain doesn't subside    HPI  Patient here with back pain that she has noted since May 2018 She was seen in 9/18 for the same concern and was treated with ibuprofen and aspercreme. She states that she is having the same back pain Patient's last menstrual period was 01/10/2018. She states that now her back pain fluctuates more  She states that she gets stiffness and tension in the low back pain.   She denies radiating pain  She states that the pain goes down her buttocks   Past Medical History:  Diagnosis Date  . Eczema     Current Outpatient Medications  Medication Sig Dispense Refill  . ibuprofen (ADVIL,MOTRIN) 600 MG tablet Take 1 tablet (600 mg total) by mouth every 8 (eight) hours as needed. 30 tablet 0   No current facility-administered medications for this visit.     Allergies:  Allergies  Allergen Reactions  . Penicillins     History reviewed. No pertinent surgical history.  Social History   Socioeconomic History  . Marital status: Single    Spouse name: None  . Number of children: None  . Years of education: None  . Highest education level: None  Social Needs  . Financial resource strain: None  . Food insecurity - worry: None  . Food insecurity - inability: None  . Transportation needs - medical: None  . Transportation needs - non-medical: None  Occupational History  . None  Tobacco Use  . Smoking status: Never Smoker  . Smokeless tobacco: Never Used  Substance and Sexual Activity  . Alcohol use: No  . Drug use: No  . Sexual activity: No  Other Topics Concern  . None  Social History Narrative  . None    Family History  Problem Relation Age of Onset  . Hyperlipidemia Mother   . Hypertension Mother        ROS Review of Systems See HPI Constitution: No fevers or chills No malaise No diaphoresis Skin: No rash or itching Eyes: no blurry vision, no double vision GU: no dysuria or hematuria Neuro: no dizziness or headaches all others reviewed and negative   Objective: Vitals:   02/18/18 1033  BP: 94/62  Pulse: 98  Resp: 16  Temp: 98.5 F (36.9 C)  TempSrc: Oral  SpO2: 98%  Weight: 128 lb 6.4 oz (58.2 kg)  Height: 5\' 9"  (1.753 m)    Physical Exam  Constitutional: She is oriented to person, place, and time. She appears well-developed and well-nourished.  HENT:  Head: Normocephalic and atraumatic.  Eyes: Conjunctivae and EOM are normal.  Cardiovascular: Normal rate, regular rhythm and normal heart sounds.  No murmur heard. Pulmonary/Chest: Effort normal and breath sounds normal. No stridor. No respiratory distress.  Musculoskeletal:       Lumbar back: She exhibits spasm. She exhibits normal range of motion, no tenderness, no bony tenderness, no swelling, no edema, no deformity, no laceration and no pain.  Neurological: She is alert and oriented to person, place, and time.  Skin: Skin is warm. Capillary refill takes less than 2 seconds.  Psychiatric: She has a normal mood and affect. Her behavior is normal. Judgment and thought content normal.  Component     Latest Ref Rng & Units 02/18/2018 02/18/2018        10:46 AM 10:46 AM  Color, UA     yellow yellow   Clarity, UA     clear clear   Glucose     negative mg/dL negative   Bilirubin, UA     negative negative negative  Specific Gravity, UA     1.010 - 1.025 1.025   RBC, UA     negative trace-lysed (A)   pH, UA     5.0 - 8.0 6.5   Protein, UA     negative mg/dL negative   Urobilinogen, UA     0.2 or 1.0 E.U./dL 0.2   Nitrite, UA     Negative Negative   Leukocytes, UA     Negative Negative     Narrative    CLINICAL DATA: Chronic back pain.  EXAM: THORACOLUMBAR SPINE 1V  COMPARISON:  None.  FINDINGS: No fracture or spondylolisthesis is noted. Disc spaces are well-maintained.  IMPRESSION: No abnormality seen in visualized portion of thoracolumbar spine.   Electronically Signed By: Lupita RaiderJames Green Jr, M.D. On: 02/18/2018 11:18     Assessment and Plan  Celine was seen today for back pain.  Diagnoses and all orders for this visit:  Chronic bilateral low back pain without sciatica- no scoliosis or bone deformity or disc disease Follow up with PT -     POCT urinalysis dipstick  Muscle spasm of back- continue stretches and ibuprofen no scoliosis or bone deformity or disc disease Follow up with PT -     Ambulatory referral to Physical Therapy -     DG THORACOLUMABAR SPINE; Future  Need for Tdap vaccination  Other orders -     Tdap vaccine greater than or equal to 7yo IM      Annise Boran A [employer]Stallings

## 2018-02-18 NOTE — Patient Instructions (Addendum)
IF you received an x-ray today, you will receive an invoice from [facility]. Please contact [facility] at [phone] with questions or concerns regarding your invoice.   IF you received labwork today, you will receive an invoice from [place]. Please contact [facility] at [phone] with questions or concerns regarding your invoice.   Our billing staff will not be able to assist you with questions regarding bills from these companies.  You will be contacted with the lab results as soon as they are available. The fastest way to get your results is to activate your My Chart account. Instructions are located on the last page of this paperwork. If you have not heard from [place] regarding the results in 2 weeks, please contact this office.     Chronic Back Pain When back pain lasts longer than 3 months, it is called chronic back pain.The cause of your back pain may not be known. Some common causes include:  Wear and tear (degenerative disease) of the bones, ligaments, or disks in your back.  Inflammation and stiffness in your back (arthritis).  People who have chronic back pain often go through certain periods in which the pain is more intense (flare-ups). Many people can learn to manage the pain with home care. Follow these instructions at home: Pay attention to any changes in your symptoms. Take these actions to help with your pain: Activity  Avoid bending and activities that make the problem worse.  Do not sit or stand in one place for long periods of time.  Take brief periods of rest throughout the day. This will reduce your pain. Resting in a lying or standing position is usually better than sitting to rest.  When you are resting for longer periods, mix in some mild activity or stretching between periods of rest. This will help to prevent stiffness and pain.  Get regular exercise. Ask your health care provider what activities are safe for you.  Do not lift  anything that is heavier than 10 lb (4.5 kg). Always use proper lifting technique, which includes: ? Bending your knees. ? Keeping the load close to your body. ? Avoiding twisting. Managing pain  If directed, apply ice to the painful area. Your health care provider may recommend applying ice during the first 24-48 hours after a flare-up begins. ? Put ice in a plastic bag. ? Place a towel between your skin and the bag. ? Leave the ice on for 20 minutes, 2-3 times per day.  After icing, apply heat to the affected area as often as told by your health care provider. Use the heat source that your health care provider recommends, such as a moist heat pack or a heating pad. ? Place a towel between your skin and the heat source. ? Leave the heat on for 20-30 minutes. ? Remove the heat if your skin turns bright red. This is especially important if you are unable to feel pain, heat, or cold. You may have a greater risk of getting burned.  Try soaking in a warm tub.  Take over-the-counter and prescription medicines only as told by your health care provider.  Keep all follow-up visits as told by your health care provider. This is important. Contact a health care provider if:  You have pain that is not relieved with rest or medicine. Get help right away if:  You have weakness or numbness in one or both of your legs or feet.  You have trouble controlling your bladder or  your bowels.  You have nausea or vomiting.  You have pain in your abdomen.  You have shortness of breath or you faint. This information is not intended to replace advice given to you by your health care provider. Make sure you discuss any questions you have with your health care provider. Document Released: 01/09/2005 Document Revised: 04/11/2016 Document Reviewed: 05/22/2015 Elsevier Interactive Patient Education  2018 [employer]

## 2018-03-25 ENCOUNTER — Encounter: Payer: Self-pay | Admitting: Physician Assistant

## 2018-06-16 ENCOUNTER — Ambulatory Visit: Payer: BLUE CROSS/BLUE SHIELD | Admitting: Urgent Care

## 2018-06-16 ENCOUNTER — Encounter: Payer: Self-pay | Admitting: Urgent Care

## 2018-06-16 ENCOUNTER — Other Ambulatory Visit: Payer: Self-pay

## 2018-06-16 VITALS — BP 102/61 | HR 65 | Temp 98.3°F | Ht 68.0 in | Wt 131.6 lb

## 2018-06-16 DIAGNOSIS — Z113 Encounter for screening for infections with a predominantly sexual mode of transmission: Secondary | ICD-10-CM | POA: Diagnosis not present

## 2018-06-16 DIAGNOSIS — R4582 Worries: Secondary | ICD-10-CM

## 2018-06-16 NOTE — Progress Notes (Signed)
MRN: [id number] DOB: Jan 30, 1991  Subjective:   Pamela Gallegos is a 27 y.o. female presenting for routine STI screen.  Patient denies fever, vaginal discharge, genital rash, dysuria, hematuria, urinary frequency, pelvic pain, nausea, vomiting, belly pain.  Patient is sexually active, consistently uses condoms for protection.  Pamela Gallegos has a current medication list which includes the following prescription(s): ibuprofen. Also is allergic to penicillins.  Pamela Gallegos  has a past medical history of Eczema.  Denies past surgical history.  Objective:   Vitals: BP 102/61 (BP Location: Left Arm, Patient Position: Sitting, Cuff Size: Normal)   Pulse 65   Temp 98.3 F (36.8 C) (Oral)   Ht 5\' 8"  (1.727 m)   Wt 131 lb 9.6 oz (59.7 kg)   LMP 05/20/2018   SpO2 100%   BMI 20.01 kg/m   Physical Exam  Constitutional: She is oriented to person, place, and time. She appears well-developed and well-nourished.  Cardiovascular: Normal rate.  Pulmonary/Chest: Effort normal.  Neurological: She is alert and oriented to person, place, and time.   Assessment and Plan :   Routine screening for STI (sexually transmitted infection)  Worries - Plan: GC/Chlamydia Probe Amp, Trichomonas vaginalis, RNA, HIV antibody, RPR  Labs pending, will treat as appropriate.  Encouraged continued safe sex practices.  Follow-up as needed.  Wallis Bamberg, PA-C [facility] at [facility] [phone] 06/16/2018  2:02 PM

## 2018-06-16 NOTE — Patient Instructions (Addendum)
Safe Sex Practicing safe sex means taking steps before and during sex to reduce your risk of:  Getting an STD (sexually transmitted disease).  Giving your partner an STD.  Unwanted pregnancy.  How can I practice safe sex?  To practice safe sex:  Limit your sexual partners to only one partner who is having sex with only you.  Avoid using alcohol and recreational drugs before having sex. These substances can affect your judgment.  Before having sex with a new partner: ? Talk to your partner about past partners, past STDs, and drug use. ? You and your partner should be screened for STDs and discuss the results with each other.  Check your body regularly for sores, blisters, rashes, or unusual discharge. If you notice any of these problems, visit your health care provider.  If you have symptoms of an infection or you are being treated for an STD, avoid sexual contact.  While having sex, use a condom. Make sure to: ? Use a condom every time you have vaginal, oral, or anal sex. Both females and males should wear condoms during oral sex. ? Keep condoms in place from the beginning to the end of sexual activity. ? Use a latex condom, if possible. Latex condoms offer the best protection. ? Use only water-based lubricants or oils to lubricate a condom. Using petroleum-based lubricants or oils will weaken the condom and increase the chance that it will break.  See your health care provider for regular screenings, exams, and tests for STDs.  Talk with your health care provider about the form of birth control (contraception) that is best for you.  Get vaccinated against hepatitis B and human papillomavirus (HPV).  If you are at risk of being infected with HIV (human immunodeficiency virus), talk with your health care provider about taking a prescription medicine to prevent HIV infection. You are considered at risk for HIV if: ? You are a man who has sex with other men. ? You are a  heterosexual man or woman who is sexually active with more than one partner. ? You take drugs by injection. ? You are sexually active with a partner who has HIV.  This information is not intended to replace advice given to you by your health care provider. Make sure you discuss any questions you have with your health care provider. Document Released: 01/09/2005 Document Revised: 04/17/2016 Document Reviewed: 10/22/2015 Elsevier Interactive Patient Education  2018 [employer] Inc.     IF you received an x-ray today, you will receive an invoice from [facility]. Please contact [facility] at [phone] with questions or concerns regarding your invoice.   IF you received labwork today, you will receive an invoice from [facility]. Please contact [facility] at [phone] with questions or concerns regarding your invoice.   Our billing staff will not be able to assist you with questions regarding bills from these companies.  You will be contacted with the lab results as soon as they are available. The fastest way to get your results is to activate your My Chart account. Instructions are located on the last page of this paperwork. If you have not heard from us regarding the results in 2 weeks, please contact this office.      

## 2018-06-17 LAB — RPR: RPR Ser Ql: NONREACTIVE

## 2018-06-17 LAB — HIV ANTIBODY (ROUTINE TESTING W REFLEX): HIV SCREEN 4TH GENERATION: NONREACTIVE

## 2018-06-17 LAB — SPECIMEN STATUS REPORT

## 2018-06-17 NOTE — Addendum Note (Signed)
Addended by: Garfield CorneaMABRY, Aldora Perman L on: 06/17/2018 12:32 PM   Modules accepted: Orders

## 2018-06-18 LAB — TRICHOMONAS VAGINALIS, PROBE AMP
Trich vag by NAA: NEGATIVE
Trich vag by NAA: NEGATIVE

## 2018-06-18 LAB — GC/CHLAMYDIA PROBE AMP
Chlamydia trachomatis, NAA: NEGATIVE
Chlamydia trachomatis, NAA: NEGATIVE
NEISSERIA GONORRHOEAE BY PCR: NEGATIVE
Neisseria gonorrhoeae by PCR: NEGATIVE

## 2018-06-29 IMAGING — DX DG THORACOLUMBAR SPINE 2V
3 series · 3 of 3 positions shown · non-contrast
Comparison: None.

CLINICAL DATA: Chronic back pain.

EXAM:
THORACOLUMBAR SPINE 1V

[tl-spine ap (1 of 2)]
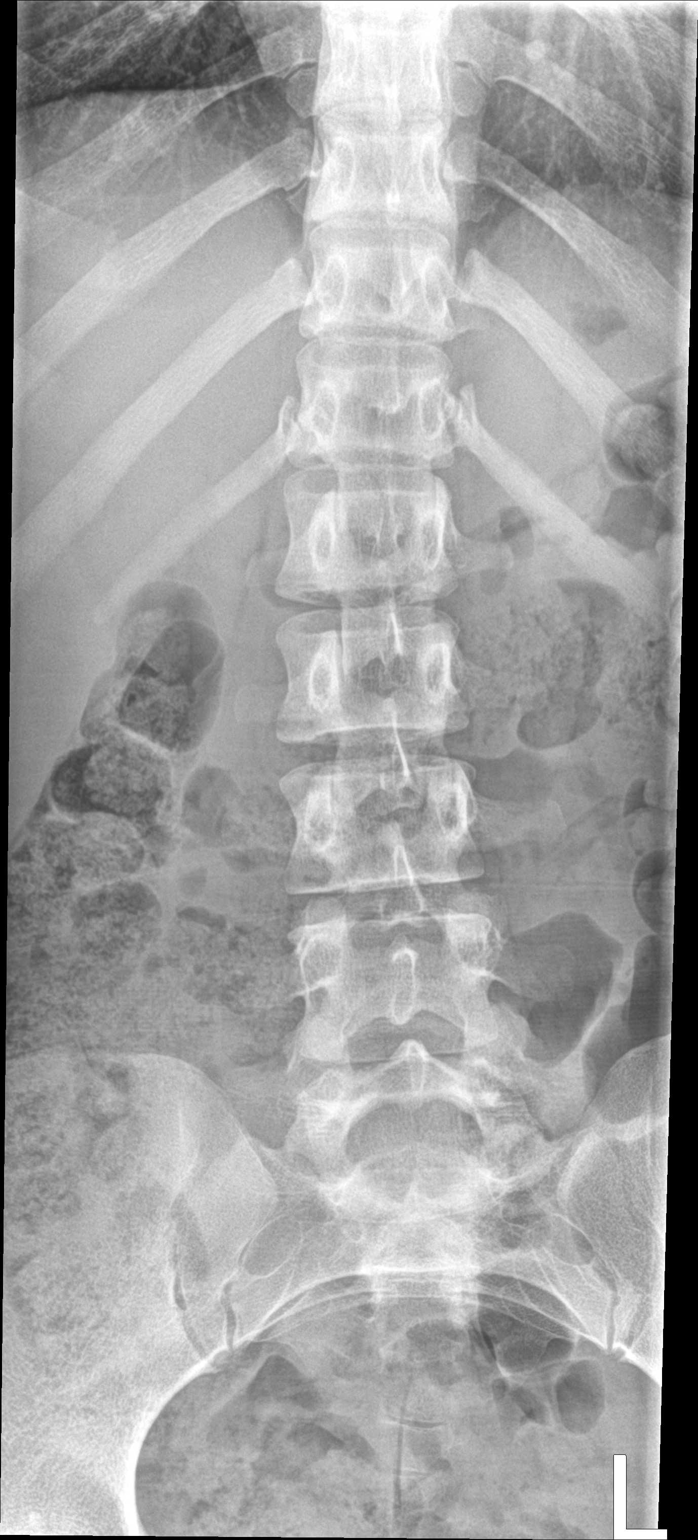

[tl-spine lat]
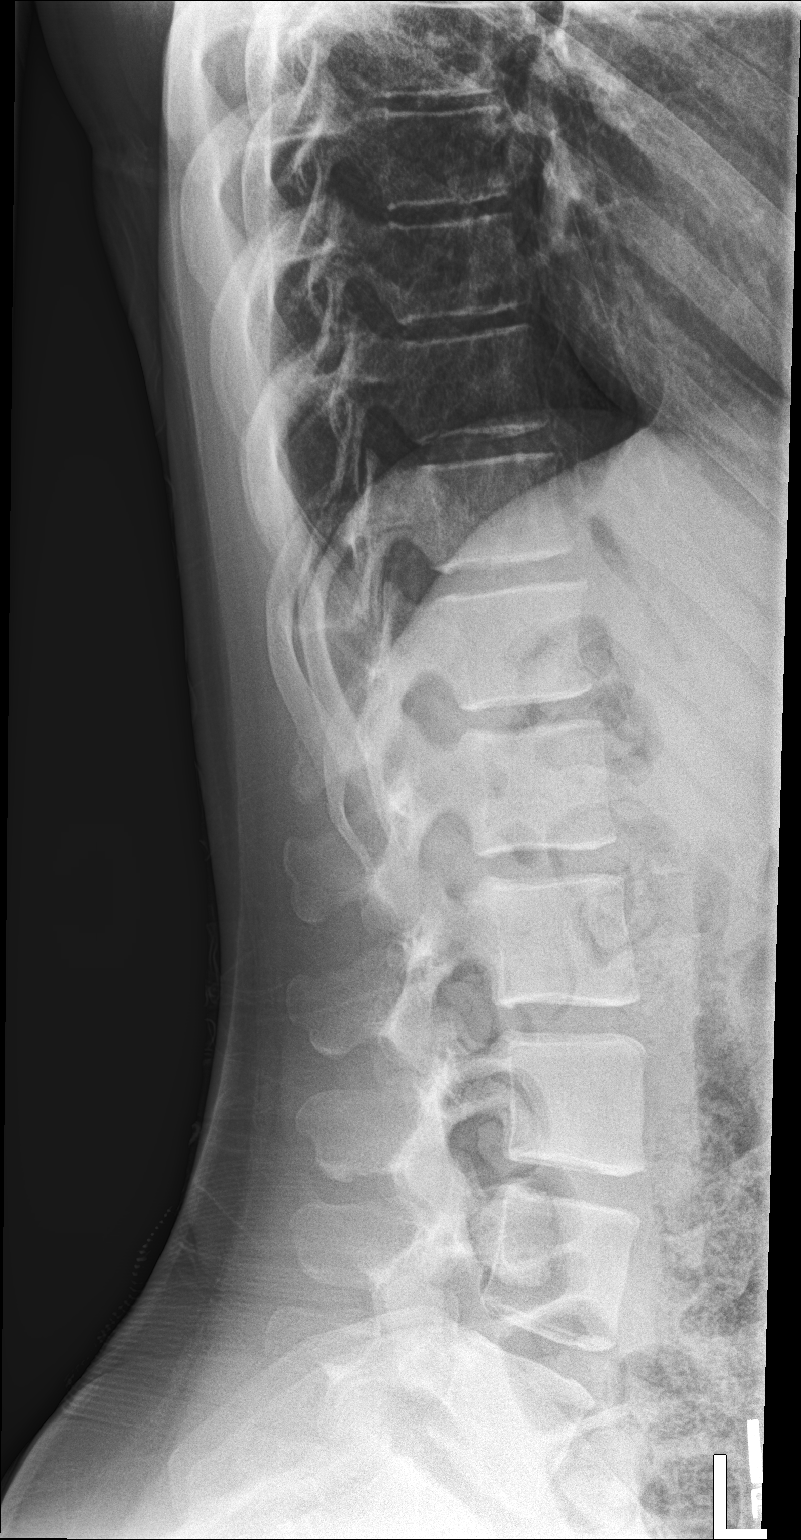

[tl-spine ap (2 of 2)]
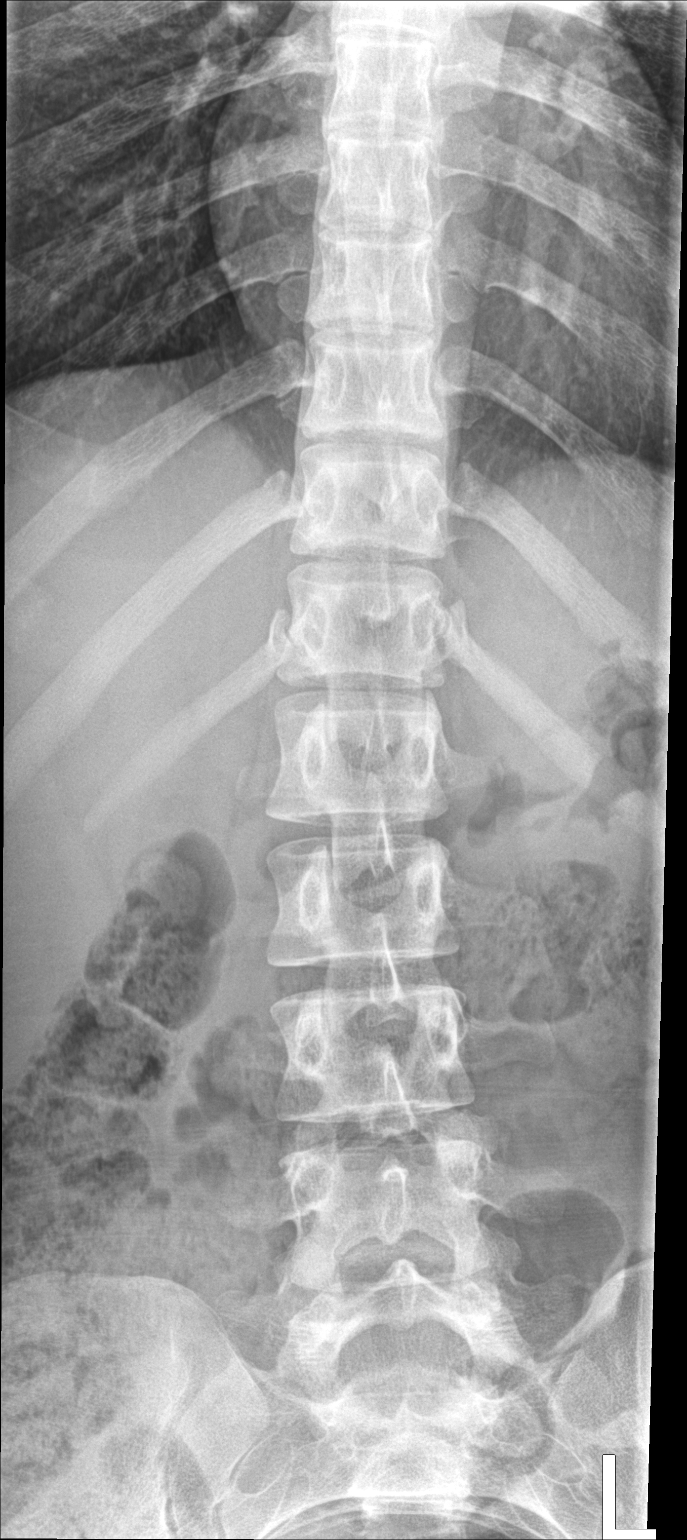

[3 of 3 positions shown; findings below may reference images not displayed]

FINDINGS: No fracture or spondylolisthesis is noted. Disc spaces are
well-maintained.
IMPRESSION: No abnormality seen in visualized portion of thoracolumbar spine.

## 2018-09-09 ENCOUNTER — Ambulatory Visit: Payer: BLUE CROSS/BLUE SHIELD | Admitting: Family Medicine

## 2018-09-09 ENCOUNTER — Encounter: Payer: Self-pay | Admitting: Family Medicine

## 2018-09-09 VITALS — BP 113/73 | HR 90 | Temp 98.6°F | Resp 17 | Ht 68.0 in | Wt 129.0 lb

## 2018-09-09 DIAGNOSIS — R6889 Other general symptoms and signs: Secondary | ICD-10-CM | POA: Diagnosis not present

## 2018-09-09 DIAGNOSIS — R6883 Chills (without fever): Secondary | ICD-10-CM | POA: Diagnosis not present

## 2018-09-09 DIAGNOSIS — J029 Acute pharyngitis, unspecified: Secondary | ICD-10-CM | POA: Diagnosis not present

## 2018-09-09 LAB — POCT INFLUENZA A/B
Influenza A, POC: NEGATIVE
Influenza B, POC: NEGATIVE

## 2018-09-09 LAB — POCT RAPID STREP A (OFFICE): Rapid Strep A Screen: NEGATIVE

## 2018-09-09 NOTE — Patient Instructions (Addendum)
Try to drink lots of fluids and get enough rest  Tylenol 500 mg (acetaminophen) 2 pills 3 times daily as needed for fever or body aches or ibuprofen 200 mg 2 to 3 pills 3 times daily as needed for fever or body aches.  Take a congested like Sudafed if you head is too congested.  If you have a lot of drainage you can go ahead and take Claritin-D or Allegra-D or Zyrtec-D instead, which has both the decongestant and an antihistamine.  If you develop more of a cough take a DM-containing cough syrup such as Robitussin-DM or Mucinex DM.  When you are feeling better go ahead and get your flu shot.  Return if worse   If you have lab work done today you will be contacted with your lab results within the next 2 weeks.  If you have not heard from [place] then please contact us. The fastest way to get your results is to register for My Chart.   IF you received an x-ray today, you will receive an invoice from [facility]. Please contact [facility] at [phone] with questions or concerns regarding your invoice.   IF you received labwork today, you will receive an invoice from [place]. Please contact [facility] at [phone] with questions or concerns regarding your invoice.   Our billing staff will not be able to assist you with questions regarding bills from these companies.  You will be contacted with the lab results as soon as they are available. The fastest way to get your results is to activate your My Chart account. Instructions are located on the last page of this paperwork. If you have not heard from [place] regarding the results in 2 weeks, please contact this office.

## 2018-09-09 NOTE — Progress Notes (Signed)
Patient ID: Pamela Gallegos, female    DOB: [birth year]  Age: 27 y.o. MRN: [id number]  Chief Complaint  Patient presents with  . Sore Throat  . Nasal Congestion  . 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  Last night she began getting ill, with a scratchy throat that then got more sore.  She also has nasal congestion, more on the right side.  She feels tired and a little achy.  Mild cough.  Has not had a flu shot.  Generally is pretty healthy.  Current allergies, medications, problem list, past/family and social histories reviewed.  Objective:  BP 113/73   Pulse 90   Temp 98.6 F (37 C) (Oral)   Resp 17   Ht 5\' 8"  (1.727 m)   Wt 129 lb (58.5 kg)   LMP 08/18/2018 (Approximate)   SpO2 98%   BMI 19.61 kg/m   No major acute distress.  Her TMs are normal.  Nose congested.  Has a ring in the left side of the nose.  Strep strep and flu test taken.  Her throat is moderately erythematous without exudate.  No nodes palpated.  Chest is clear to auscultation.  Heart regular without murmur.  Afebrile at this time.  Assessment & Plan:   Assessment: 1. Sore throat   2. Chills   3. Flu-like symptoms       Plan: See instructions  Orders Placed This Encounter  Procedures  . POCT rapid strep A  . POCT Influenza A/B    No orders of the defined types were placed in this encounter.  Results for orders placed or performed in visit on 09/09/18  POCT rapid strep A  Result Value Ref Range   Rapid Strep A Screen Negative Negative  POCT Influenza A/B  Result Value Ref Range   Influenza A, POC Negative Negative   Influenza B, POC Negative Negative         Patient Instructions   Try to drink lots of fluids and get enough rest  Tylenol 500 mg (acetaminophen) 2 pills 3 times daily as needed for fever or body aches or ibuprofen 200 mg 2 to 3 pills 3 times daily as needed for fever or body  aches.  Take a congested like Sudafed if you head is too congested.  If you have a lot of drainage you can go ahead and take Claritin-D or Allegra-D or Zyrtec-D instead, which has both the decongestant and an antihistamine.  If you develop more of a cough take a DM-containing cough syrup such as Robitussin-DM or Mucinex DM.  When you are feeling better go ahead and get your flu shot.  Return if worse   If you have lab work done today you will be contacted with your lab results within the next 2 weeks.  If you have not heard from [place] then please contact us. The fastest way to get your results is to register for My Chart.   IF you received an x-ray today, you will receive an invoice from [facility]. Please contact [facility] at [phone] with questions or concerns regarding your invoice.   IF you received labwork today, you will receive an invoice from [place]. Please contact [facility] at [phone] with questions or concerns regarding your invoice.   Our billing staff will not be able to assist you with questions regarding bills from these companies.  You will be contacted  with the lab results as soon as they are available. The fastest way to get your results is to activate your My Chart account. Instructions are located on the last page of this paperwork. If you have not heard from [place] regarding the results in 2 weeks, please contact this office.         No follow-ups on file.   Janace Hoard, MD 09/09/2018

## 2019-05-13 ENCOUNTER — Other Ambulatory Visit: Payer: Self-pay

## 2019-05-13 ENCOUNTER — Encounter (HOSPITAL_COMMUNITY): Payer: Self-pay

## 2019-05-13 ENCOUNTER — Emergency Department (HOSPITAL_COMMUNITY)
Admission: EM | Admit: 2019-05-13 | Discharge: 2019-05-13 | Disposition: A | Discharge status: Home / Self Care | Payer: Self-pay | Attending: Emergency Medicine | Admitting: Emergency Medicine

## 2019-05-13 DIAGNOSIS — F419 Anxiety disorder, unspecified: Secondary | ICD-10-CM | POA: Insufficient documentation

## 2019-05-13 MED ORDER — HYDROXYZINE PAMOATE 25 MG PO CAPS: 25.0000 mg | ORAL_CAPSULE | Freq: Once | ORAL | 0 refills | Status: DC | PRN

## 2019-05-13 MED ORDER — ESCITALOPRAM OXALATE 20 MG PO TABS: 20.0000 mg | ORAL_TABLET | Freq: Every day | ORAL | 0 refills | Status: DC

## 2019-05-13 NOTE — ED Provider Notes (Signed)
[facility]-EMERGENCY DEPT Provider Note   CSN: [id number] Arrival date & time: 05/13/19  2144    History   Chief Complaint No chief complaint on file.   HPI Pamela Gallegos is a 28 y.o. female.     Patient here for medication refill of her antidepressant.  Denies any suicidal homicidal ideation.  The history is provided by the patient.  Illness  Location:  General Severity:  Mild Onset quality:  Gradual Chronicity:  Chronic Associated symptoms: no abdominal pain, no fatigue and no fever     Past Medical History:  Diagnosis Date  . Eczema     There are no active problems to display for this patient.   History reviewed. No pertinent surgical history.   OB History   No obstetric history on file.      Home Medications    Prior to Admission medications   Medication Sig Start Date End Date Taking? Authorizing Provider  escitalopram (LEXAPRO) 20 MG tablet Take 1 tablet (20 mg total) by mouth daily for 30 days. 05/13/19 06/12/19  Jw Covin, DO  hydrOXYzine (VISTARIL) 25 MG capsule Take 1 capsule (25 mg total) by mouth once as needed for up to 10 doses for anxiety. 05/13/19   Kalisa Girtman, DO  ibuprofen (ADVIL,MOTRIN) 600 MG tablet Take 1 tablet (600 mg total) by mouth every 8 (eight) hours as needed. Patient not taking: Reported on 06/16/2018 09/06/17   Doristine BosworthStallings, Zoe A, MD    Family History Family History  Problem Relation Age of Onset  . Hyperlipidemia Mother   . Hypertension Mother     Social History Social History   Tobacco Use  . Smoking status: Never Smoker  . Smokeless tobacco: Never Used  Substance Use Topics  . Alcohol use: No  . Drug use: No     Allergies   Penicillins   Review of Systems Review of Systems  Constitutional: Negative for activity change, appetite change, fatigue and fever.  Gastrointestinal: Negative for abdominal pain.  Psychiatric/Behavioral: Negative for agitation, confusion, dysphoric mood,  hallucinations, sleep disturbance and suicidal ideas. The patient is not nervous/anxious and is not hyperactive.      Physical Exam Updated Vital Signs  ED Triage Vitals  Enc Vitals Group     BP 05/13/19 2154 130/85     Pulse Rate 05/13/19 2154 68     Resp 05/13/19 2154 20     Temp 05/13/19 2154 98.4 F (36.9 C)     Temp Source 05/13/19 2154 Oral     SpO2 05/13/19 2154 100 %     Weight --      Height --      Head Circumference --      Peak Flow --      Pain Score 05/13/19 2155 0     Pain Loc --      Pain Edu? --      Excl. in GC? --     Physical Exam Constitutional:      General: She is not in acute distress.    Appearance: She is not ill-appearing.  HENT:     Mouth/Throat:     Mouth: Mucous membranes are moist.  Pulmonary:     Effort: Pulmonary effort is normal.  Neurological:     Mental Status: She is alert.  Psychiatric:        Mood and Affect: Mood normal.        Behavior: Behavior normal.  Thought Content: Thought content normal. Thought content does not include homicidal or suicidal ideation. Thought content does not include homicidal or suicidal plan.        Judgment: Judgment normal.      ED Treatments / Results  Labs (all labs ordered are listed, but only abnormal results are displayed) Labs Reviewed - No data to display  EKG None  Radiology No results found.  Procedures Procedures (including critical care time)  Medications Ordered in ED Medications - No data to display   Initial Impression / Assessment and Plan / ED Course  I have reviewed the triage vital signs and the nursing notes.  Pertinent labs & imaging results that were available during my care of the patient were reviewed by me and considered in my medical decision making (see chart for details).        Pamela Gallegos is a 28 year old female who presents to the ED for medication refill for her anxiety/depression.  Patient takes 20 mg of Lexapro a day.  Patient is a  Consulting civil engineer at [employer] and usually got her mental health care through college psychiatrist.  She no longer has insurance and does not have a psychiatrist.  Patient overall states that her anxiety and depression are well controlled with Lexapro but states that she wishes she had something else to help at times.  Therefore will prescribe small dose of Vistaril to see if that helps.  Gave her information to follow-up with [facility] and to hopefully establish some mental health care there.  She denies any suicidal homicidal ideation.  Patient was discharged from ED in good condition.  Given return precautions.  This chart was dictated using voice recognition software.  Despite best efforts to proofread,  errors can occur which can change the documentation meaning.    Final Clinical Impressions(s) / ED Diagnoses   Final diagnoses:  Anxiety    ED Discharge Orders         Ordered    escitalopram (LEXAPRO) 20 MG tablet  Daily     05/13/19 2223    hydrOXYzine (VISTARIL) 25 MG capsule  Once PRN     05/13/19 2223           Virgina Norfolk, DO 05/13/19 2242

## 2019-05-13 NOTE — ED Notes (Signed)
Bed: WTR5 Expected date:  Expected time:  Means of arrival:  Comments: 

## 2019-05-13 NOTE — ED Triage Notes (Signed)
Pt states that she no longer has insurance and has lost her psychiatrist that she was seeing through [employer], she has one more day of her medication Pt denies SI/HI

## 2019-09-27 ENCOUNTER — Telehealth: Payer: Self-pay | Admitting: Family Medicine

## 2019-09-27 NOTE — Telephone Encounter (Signed)
Patient is calling to schedule an appt for Swelling in both hands. Please advise [phone]

## 2019-09-29 NOTE — Telephone Encounter (Signed)
Please schedule appointment.

## 2019-10-01 NOTE — Telephone Encounter (Signed)
Pt is scheduled to come in Nov for same complaint

## 2019-10-22 ENCOUNTER — Ambulatory Visit: Payer: BC Managed Care – PPO | Admitting: Family Medicine

## 2019-10-22 ENCOUNTER — Other Ambulatory Visit: Payer: Self-pay

## 2019-10-22 ENCOUNTER — Encounter: Payer: Self-pay | Admitting: Family Medicine

## 2019-10-22 VITALS — BP 112/70 | HR 55 | Temp 98.2°F | Ht 69.0 in | Wt 136.0 lb

## 2019-10-22 DIAGNOSIS — M2540 Effusion, unspecified joint: Secondary | ICD-10-CM | POA: Diagnosis not present

## 2019-10-22 DIAGNOSIS — L2084 Intrinsic (allergic) eczema: Secondary | ICD-10-CM | POA: Diagnosis not present

## 2019-10-22 DIAGNOSIS — M545 Low back pain, unspecified: Secondary | ICD-10-CM

## 2019-10-22 DIAGNOSIS — G8929 Other chronic pain: Secondary | ICD-10-CM

## 2019-10-22 NOTE — Progress Notes (Signed)
Established Patient Office Visit  Subjective:  Patient ID: Pamela Gallegos, female    DOB: 03-Nov-1991  Age: 28 y.o. MRN: [id number]  CC:  Chief Complaint  Patient presents with  . hands problem    Pt stated both hands are swelling especially first thing in the morning and go away about 1 hour--3 months  . Back Pain    lower back--sharp pain going down both upper thigh--more than 1 year.    HPI Pamela Gallegos presents for   Patient reports that for 3 months now she has been having stiffness and swelling of her middle and 4th finger bilaterally It seems worse on the right but resolves in the morning after moving the joints She is right handed She notices that her swelling is provoked by alcohol consumption which causes a more intense swellings.  No other joints are involved There is no family history of RA No family history of Lupus  She also reports that she continues to have back pains  She is not taking any of the antiinflammatory meds like ibuprofen She reports that she does not like taking medications  Patient's last menstrual period was 10/13/2019. She gets regular cycles [id number], medical AB  No history of blood clots in the family. 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   Past Medical History:  Diagnosis Date  . Eczema     History reviewed. No pertinent surgical history.  Family History  Problem Relation Age of Onset  . Hyperlipidemia Mother   . Hypertension Mother     Social History   Socioeconomic History  . Marital status: Single    Spouse name: Not on file  . Number of children: Not on file  . Years of education: Not on file  . Highest education level: Not on file  Occupational  History  . Not on file  Social Needs  . Financial resource strain: Not on file  . Food insecurity    Worry: Not on file    Inability: Not on file  . Transportation needs    Medical: Not on file    Non-medical: Not on file  Tobacco Use  . Smoking status: Never Smoker  . Smokeless tobacco: Never Used  Substance and Sexual Activity  . Alcohol use: No  . Drug use: No  . Sexual activity: Never  Lifestyle  . Physical activity    Days per week: Not on file    Minutes per session: Not on file  . Stress: Not on file  Relationships  . Social Musicianconnections    Talks on phone: Not on file    Gets together: Not on file    Attends religious service: Not on file    Active member of club or organization: Not on file    Attends meetings of clubs or organizations: Not on file    Relationship status: Not on file  . Intimate partner violence    Fear of current or ex partner: Not on file    Emotionally abused: Not on file    Physically abused: Not on file    Forced sexual activity: Not on file  Other Topics Concern  . Not on file  Social History Narrative  .  Not on file    Outpatient Medications Prior to Visit  Medication Sig Dispense Refill  . ibuprofen (ADVIL,MOTRIN) 600 MG tablet Take 1 tablet (600 mg total) by mouth every 8 (eight) hours as needed. (Patient not taking: Reported on 06/16/2018) 30 tablet 0  . escitalopram (LEXAPRO) 20 MG tablet Take 1 tablet (20 mg total) by mouth daily for 30 days. 30 tablet 0  . hydrOXYzine (VISTARIL) 25 MG capsule Take 1 capsule (25 mg total) by mouth once as needed for up to 10 doses for anxiety. 10 capsule 0   No facility-administered medications prior to visit.     Allergies  Allergen Reactions  . Penicillins     ROS Review of Systems Review of Systems  Constitutional: Negative for activity change, appetite change, chills and fever.  HENT: Negative for congestion, nosebleeds, trouble swallowing and voice change.   Respiratory: Negative for  cough, shortness of breath and wheezing.   Gastrointestinal: Negative for diarrhea, nausea and vomiting.  Genitourinary: Negative for difficulty urinating, dysuria, flank pain and hematuria.  Musculoskeletal: see hpi Neurological: Negative for dizziness, speech difficulty, light-headedness and numbness.  Skin: pt has itching of the skin, she has eczema and is using castor oil, jojoba oil and shae butter, no history of asthma is noted, she is not taking zyrtec See HPI. All other review of systems negative.     Objective:    Physical Exam  BP 112/70   Pulse (!) 55   Temp 98.2 F (36.8 C)   Ht 5\' 9"  (1.753 m)   Wt 136 lb (61.7 kg)   LMP 10/13/2019   SpO2 99%   BMI 20.08 kg/m  Wt Readings from Last 3 Encounters:  10/22/19 136 lb (61.7 kg)  09/09/18 129 lb (58.5 kg)  06/16/18 131 lb 9.6 oz (59.7 kg)   Physical Exam  Constitutional: Oriented to person, place, and time. Appears well-developed and well-nourished.  HENT:  Head: Normocephalic and atraumatic.  Eyes: Conjunctivae and EOM are normal.  Cardiovascular: Normal rate, regular rhythm, normal heart sounds and intact distal pulses.  No murmur heard. Pulmonary/Chest: Effort normal and breath sounds normal. No stridor. No respiratory distress. Has no wheezes.  Neurological: Is alert and oriented to person, place, and time.  Skin: Skin is warm. Capillary refill takes less than 2 seconds. Previous acne scars Psychiatric: Has a normal mood and affect. Behavior is normal. Judgment and thought content normal.   Lumbar Radiculopathy Exam Back exam: full range of motion, no tenderness, palpable spasm or pain on motion. Straight-leg raise:  Negative bilaterally Reflexes:       Right leg: 2+ at knees bilaterally      Left leg: 2+ at knees bilaterally Strength: normal and equal bilaterally  Sensory exam: normal in both lower extremities.  Able to toe walk, heel walk without difficulty or obvious weakness. No obvious pain with hip  motion or log rolling of leg.  There are no preventive care reminders to display for this patient.  There are no preventive care reminders to display for this patient.  No results found for: TSH Lab Results  Component Value Date   WBC 3.8 02/19/2017   HGB 12.5 02/19/2017   HCT 37.6 02/19/2017   MCV 94 02/19/2017   PLT 394 (H) 02/19/2017      Assessment & Plan:   Problem List Items Addressed This Visit    None    Visit Diagnoses    Joint swelling    -  Primary Will check  labs, advised PT referral    Relevant Orders   CBC   Rheumatoid factor   Basic metabolic panel   Chronic bilateral low back pain without sciatica       Relevant Orders   Ambulatory referral to PT for evaluation and treatment Advised pt to take ibuprofen Take picture of the joints when the swelling is present    Eczema-  Discussed home skin care  No orders of the defined types were placed in this encounter.   Follow-up: No follow-ups on file.    Doristine Bosworth, MD

## 2019-10-22 NOTE — Patient Instructions (Addendum)
For eczema Use unscented soap like Dove or CeraVe or Cetaphil bar soap Use luke warm water Use coconut oil or mineral oil on DAMP skin Avoid perfumes  Follow up with PT   Chronic Back Pain When back pain lasts longer than 3 months, it is called chronic back pain. Pain may get worse at certain times (flare-ups). There are things you can do at home to manage your pain. Follow these instructions at home: Activity      Avoid bending and other activities that make pain worse.  When standing: ? Keep your upper back and neck straight. ? Keep your shoulders pulled back. ? Avoid slouching.  When sitting: ? Keep your back straight. ? Relax your shoulders. Do not round your shoulders or pull them backward.  Do not sit or stand in one place for long periods of time.  Take short rest breaks during the day. Lying down or standing is usually better than sitting. Resting can help relieve pain.  When sitting or lying down for a long time, do some mild activity or stretching. This will help to prevent stiffness and pain.  Get regular exercise. Ask your doctor what activities are safe for you.  Do not lift anything that is heavier than 10 lb (4.5 kg). To prevent injury when you lift things: ? Bend your knees. ? Keep the weight close to your body. ? Avoid twisting. Managing pain  If told, put ice on the painful area. Your doctor may tell you to use ice for 24-48 hours after a flare-up starts. ? Put ice in a plastic bag. ? Place a towel between your skin and the bag. ? Leave the ice on for 20 minutes, 2-3 times a day.  If told, put heat on the painful area as often as told by your doctor. Use the heat source that your doctor recommends, such as a moist heat pack or a heating pad. ? Place a towel between your skin and the heat source. ? Leave the heat on for 20-30 minutes. ? Remove the heat if your skin turns bright red. This is especially important if you are unable to feel pain, heat,  or cold. You may have a greater risk of getting burned.  Soak in a warm bath. This can help relieve pain.  Take over-the-counter and prescription medicines only as told by your doctor. General instructions  Sleep on a firm mattress. Try lying on your side with your knees slightly bent. If you lie on your back, put a pillow under your knees.  Keep all follow-up visits as told by your doctor. This is important. Contact a doctor if:  You have pain that does not get better with rest or medicine. Get help right away if:  One or both of your arms or legs feel weak.  One or both of your arms or legs lose feeling (numbness).  You have trouble controlling when you poop (bowel movement) or pee (urinate).  You feel sick to your stomach (nauseous).  You throw up (vomit).  You have belly (abdominal) pain.  You have shortness of breath.  You pass out (faint). Summary  When back pain lasts longer than 3 months, it is called chronic back pain.  Pain may get worse at certain times (flare-ups).  Use ice and heat as told by your doctor. Your doctor may tell you to use ice after flare-ups. This information is not intended to replace advice given to you by your health care provider. Make sure  you discuss any questions you have with your health care provider. Document Released: 05/20/2008 Document Revised: 03/25/2019 Document Reviewed: 07/17/2017 Elsevier Patient Education  2020 [employer].

## 2019-10-23 LAB — BASIC METABOLIC PANEL
BUN/Creatinine Ratio: 14 (ref 9–23)
BUN: 10 mg/dL (ref 6–20)
CO2: 21 mmol/L (ref 20–29)
Calcium: 9.3 mg/dL (ref 8.7–10.2)
Chloride: 103 mmol/L (ref 96–106)
Creatinine, Ser: 0.73 mg/dL (ref 0.57–1.00)
GFR calc Af Amer: 130 mL/min/{1.73_m2} (ref >59)
GFR calc non Af Amer: 112 mL/min/{1.73_m2} (ref >59)
Glucose: 79 mg/dL (ref 65–99)
Potassium: 4.1 mmol/L (ref 3.5–5.2)
Sodium: 139 mmol/L (ref 134–144)

## 2019-10-23 LAB — CBC
Hematocrit: 37.2 % (ref 34.0–46.6)
Hemoglobin: 12.5 g/dL (ref 11.1–15.9)
MCH: 31.5 pg (ref 26.6–33.0)
MCHC: 33.6 g/dL (ref 31.5–35.7)
MCV: 94 fL (ref 79–97)
Platelets: 372 10*3/uL (ref 150–450)
RBC: 3.97 x10E6/uL (ref 3.77–5.28)
RDW: 12.1 % (ref 11.7–15.4)
WBC: 2.8 10*3/uL — ABNORMAL LOW (ref 3.4–10.8)

## 2019-10-23 LAB — RHEUMATOID FACTOR: Rheumatoid fact SerPl-aCnc: <10 IU/mL (ref 0.0–13.9)

## 2019-10-28 ENCOUNTER — Telehealth: Payer: Self-pay | Admitting: Family Medicine

## 2019-10-28 NOTE — Telephone Encounter (Signed)
Copied from [phone]. Topic: General - Other >> Oct 28, 2019 11:37 AM Rainey Pines A wrote: Patient would like a callback from nurse to go over test results.

## 2019-10-29 ENCOUNTER — Ambulatory Visit: Payer: BLUE CROSS/BLUE SHIELD | Admitting: Family Medicine

## 2019-11-04 NOTE — Telephone Encounter (Signed)
Results were sent by mychart.

## 2020-02-11 ENCOUNTER — Other Ambulatory Visit: Payer: Self-pay

## 2020-02-11 ENCOUNTER — Encounter: Payer: Self-pay | Admitting: Family Medicine

## 2020-02-11 ENCOUNTER — Other Ambulatory Visit (HOSPITAL_COMMUNITY)
Admission: RE | Admit: 2020-02-11 | Discharge: 2020-02-11 | Disposition: A | Discharge status: Home / Self Care | Payer: Medicaid Other | Source: Ambulatory Visit | Attending: Family Medicine | Admitting: Family Medicine

## 2020-02-11 ENCOUNTER — Ambulatory Visit (INDEPENDENT_AMBULATORY_CARE_PROVIDER_SITE_OTHER): Payer: BC Managed Care – PPO | Admitting: Family Medicine

## 2020-02-11 VITALS — BP 114/69 | HR 57 | Temp 97.9°F | Ht 69.0 in | Wt 133.2 lb

## 2020-02-11 DIAGNOSIS — Z113 Encounter for screening for infections with a predominantly sexual mode of transmission: Secondary | ICD-10-CM

## 2020-02-11 DIAGNOSIS — N898 Other specified noninflammatory disorders of vagina: Secondary | ICD-10-CM

## 2020-02-11 DIAGNOSIS — R197 Diarrhea, unspecified: Secondary | ICD-10-CM

## 2020-02-11 DIAGNOSIS — Z30016 Encounter for initial prescription of transdermal patch hormonal contraceptive device: Secondary | ICD-10-CM | POA: Diagnosis not present

## 2020-02-11 DIAGNOSIS — Z124 Encounter for screening for malignant neoplasm of cervix: Secondary | ICD-10-CM | POA: Diagnosis not present

## 2020-02-11 DIAGNOSIS — L2084 Intrinsic (allergic) eczema: Secondary | ICD-10-CM

## 2020-02-11 LAB — POCT URINE PREGNANCY: Preg Test, Ur: NEGATIVE

## 2020-02-11 MED ORDER — XULANE 150-35 MCG/24HR TD PTWK: 1.0000 | MEDICATED_PATCH | TRANSDERMAL | 12 refills | Status: DC

## 2020-02-11 MED ORDER — TRIAMCINOLONE ACETONIDE 0.1 % EX CREA: 1.0000 "application " | TOPICAL_CREAM | Freq: Two times a day (BID) | CUTANEOUS | 1 refills | Status: AC

## 2020-02-11 NOTE — Progress Notes (Signed)
Established Patient Office Visit  Subjective:  Patient ID: Pamela Gallegos, female    DOB: 1991-05-12  Age: 29 y.o. MRN: [id number]  CC:  Chief Complaint  Patient presents with  . Eczema    patchy on both theighs.   . Exposure to STD    unprotected and new partner X2 wks ago, dryness     HPI Pamela Gallegos presents for   Eczema Patient reports that she is having itching on her thighs She states that she has a history of eczema She has been itching since 2 months She has some itching on her thighs She reports that the skin is getting darker She is taking hot showers  She is using dr. Alphia Moh soap and dove sensitive skin She uses coconut oil  STD screening and contraception  She become sexually active with a new partner 2 weeks She is not using condoms She states that it was a one time thing but no condoms have been used She denies any vaginal discharge She had vaginal dryness during sex She used nuvaring before but did not like it because it caused her menstrual cramps to be in her back.    Diarrhea She reports intermittent diarrhea Her mother has lactose intolerance She is wondering if she has it too but she likes milk products She denies blood in stool or vomiting     Past Medical History:  Diagnosis Date  . Eczema     No past surgical history on file.  Family History  Problem Relation Age of Onset  . Hyperlipidemia Mother   . Hypertension Mother     Social History   Socioeconomic History  . Marital status: Single    Spouse name: Not on file  . Number of children: Not on file  . Years of education: Not on file  . Highest education level: Not on file  Occupational History  . Not on file  Tobacco Use  . Smoking status: Never Smoker  . Smokeless tobacco: Never Used  Substance and Sexual Activity  . Alcohol use: No  . Drug use: No  . Sexual activity: Never  Other Topics Concern  . Not on file  Social History Narrative  . Not on file     Social Determinants of Health   Financial Resource Strain:   . Difficulty of Paying Living Expenses: Not on file  Food Insecurity:   . Worried About Programme researcher, broadcasting/film/video in the Last Year: Not on file  . Ran Out of Food in the Last Year: Not on file  Transportation Needs:   . Lack of Transportation (Medical): Not on file  . Lack of Transportation (Non-Medical): Not on file  Physical Activity:   . Days of Exercise per Week: Not on file  . Minutes of Exercise per Session: Not on file  Stress:   . Feeling of Stress : Not on file  Social Connections:   . Frequency of Communication with Friends and Family: Not on file  . Frequency of Social Gatherings with Friends and Family: Not on file  . Attends Religious Services: Not on file  . Active Member of Clubs or Organizations: Not on file  . Attends Banker Meetings: Not on file  . Marital Status: Not on file  Intimate Partner Violence:   . Fear of Current or Ex-Partner: Not on file  . Emotionally Abused: Not on file  . Physically Abused: Not on file  . Sexually Abused: Not on file  Outpatient Medications Prior to Visit  Medication Sig Dispense Refill  . ibuprofen (ADVIL,MOTRIN) 600 MG tablet Take 1 tablet (600 mg total) by mouth every 8 (eight) hours as needed. 30 tablet 0   No facility-administered medications prior to visit.    Allergies  Allergen Reactions  . Penicillins     ROS Review of Systems Review of Systems  Constitutional: Negative for activity change, appetite change, chills and fever.  HENT: Negative for congestion, nosebleeds, trouble swallowing and voice change.   Respiratory: Negative for cough, shortness of breath and wheezing.   Gastrointestinal: see hpi Genitourinary: Negative for difficulty urinating, dysuria, flank pain and hematuria.  Musculoskeletal: Negative for back pain, joint swelling and neck pain.  Neurological: Negative for dizziness, speech difficulty, light-headedness and  numbness.  See HPI. All other review of systems negative.     Objective:    Physical Exam  BP 114/69   Pulse (!) 57   Temp 97.9 F (36.6 C) (Temporal)   Ht 5\' 9"  (1.753 m)   Wt 133 lb 3.2 oz (60.4 kg)   LMP 02/09/2020   SpO2 100%   BMI 19.67 kg/m  Wt Readings from Last 3 Encounters:  02/11/20 133 lb 3.2 oz (60.4 kg)  10/22/19 136 lb (61.7 kg)  09/09/18 129 lb (58.5 kg)    Physical Exam  Constitutional: Oriented to person, place, and time. Appears well-developed and well-nourished.  HENT:  Head: Normocephalic and atraumatic.  Eyes: Conjunctivae and EOM are normal.  Cardiovascular: Normal rate, regular rhythm, normal heart sounds and intact distal pulses.  No murmur heard. Pulmonary/Chest: Effort normal and breath sounds normal. No stridor. No respiratory distress. Has no wheezes.  Neurological: Is alert and oriented to person, place, and time.  Skin: Skin is warm. Capillary refill takes less than 2 seconds.  Psychiatric: Has a normal mood and affect. Behavior is normal. Judgment and thought content normal.   Vaginal exam- Chaperone Present Labia normal bilaterally without skin lesions Urethral meatus normal appearing without erythema Vagina without discharge No CMT, ovaries small and not palpable Uterus midline, nontender   Health Maintenance Due  Topic Date Due  . PAP-Cervical Cytology Screening  01/25/2020  . PAP SMEAR-Modifier  01/25/2020    There are no preventive care reminders to display for this patient.  No results found for: TSH Lab Results  Component Value Date   WBC 2.8 (L) 10/22/2019   HGB 12.5 10/22/2019   HCT 37.2 10/22/2019   MCV 94 10/22/2019   PLT 372 10/22/2019   Lab Results  Component Value Date   NA 139 10/22/2019   K 4.1 10/22/2019   CO2 21 10/22/2019   GLUCOSE 79 10/22/2019   BUN 10 10/22/2019   CREATININE 0.73 10/22/2019   CALCIUM 9.3 10/22/2019   No results found for: CHOL No results found for: HDL No results found for:  LDLCALC No results found for: TRIG No results found for: CHOLHDL No results found for: 13/05/2019    Assessment & Plan:   Problem List Items Addressed This Visit    None    Visit Diagnoses    Vaginal dryness    -  Primary   Routine screening for STI (sexually transmitted infection)       Relevant Orders   RPR   HIV antibody (with reflex)   Hepatitis B surface antibody,quantitative   Screening for cervical cancer       Relevant Orders   Cytology - PAP   Screen for STD (sexually transmitted disease)  Relevant Orders   Cytology - PAP   Encounter for initial prescription of transdermal patch hormonal contraceptive device       Relevant Orders   POCT urine pregnancy (Completed)      Education given regarding options for contraception, including barrier methods, injectable contraception, IUD placement, oral contraceptives.  Patient elects to have patch. Discussed risks and benefits.   Diarrhea Pt was advised to keep a food journal Concern about lactose intolerance given her family history and ethnicity Pt has eczema thus higher risk for lactose intolerance Add fiber in the form of foods and metamucil   Eczema Advised to take otc zyrtec Refilled triamcinolone to help with skin irritation    Meds ordered this encounter  Medications  . triamcinolone cream (KENALOG) 0.1 %    Sig: Apply 1 application topically 2 (two) times daily.    Dispense:  30 g    Refill:  1  . norelgestromin-ethinyl estradiol Marilu Favre) 150-35 MCG/24HR transdermal patch    Sig: Place 1 patch onto the skin once a week.    Dispense:  3 patch    Refill:  12    Follow-up: No follow-ups on file.    Forrest Moron, MD

## 2020-02-11 NOTE — Patient Instructions (Addendum)
If you have lab work done today you will be contacted with your lab results within the next 2 weeks.  If you have not heard from [place] then please contact us. The fastest way to get your results is to register for My Chart.   IF you received an x-ray today, you will receive an invoice from [facility]. Please contact [facility] at [phone] with questions or concerns regarding your invoice.   IF you received labwork today, you will receive an invoice from [facility]. Please contact [facility] at [phone] with questions or concerns regarding your invoice.   Our billing staff will not be able to assist you with questions regarding bills from these companies.  You will be contacted with the lab results as soon as they are available. The fastest way to get your results is to activate your My Chart account. Instructions are located on the last page of this paperwork. If you have not heard from [place] regarding the results in 2 weeks, please contact this office.     Lactose-Controlled Eating Plan, Adult Lactose intolerance is when the body is not able to digest lactose, a natural sugar that is found in milk and milk products. If you are lactose intolerant, avoiding foods and drinks that contain lactose may help ease digestive problems such as diarrhea or stomach pain. What are tips for following this plan? Reading food labels  Check food ingredient lists carefully. Avoid foods made with butter, cream, milk, milk solids, milk powder, curd, caseinate, or whey.  Avoid products with the note "may contain milk."  Look for the words "lactose-free" or "lactose-reduced" on food labels. You can eat lactose-free foods, and you may be able to eat small amounts of lactose-reduced foods. Shopping  Look for nondairy substitutes, such as: ? Nondairy creamer. ? Almond or soy milk. ? Soy or coconut yogurt. ? Dairy-free cheese.  Buy lactose-free cow milk. Cooking  Avoid cooking with  butter. Use vegetable, nut, and seed oils instead.  Prepare soups without cream. Use other products to thicken soups, such as corn starch or tomato paste. Meal planning  Avoid eating foods that contain lactose.  Some people with lactose intolerance can eat foods that contain small amounts of lactose. Foods that contain less than 1 gram of lactose per serving include: ? 1-2 oz of aged cheese, such as Parmesan, Swiss, or cheddar. ? 2 Tbsp of cream cheese. ? ? cup of cottage cheese. ?  cup of ricotta cheese.  Some people are able to tolerate cultured dairy products, such as yogurt, buttermilk, and kefir. The healthy bacteria in these products helps you digest lactose.  If you decide to try a food that contains lactose: ? Eat only one food with lactose in it at a time. ? Eat only a small amount of the food. ? Stop eating the food if your symptoms return. Getting enough calcium  Milk and milk products contain a lot of calcium, which is an important nutrient for your health. When you avoid milk and milk products, make sure to get calcium from other foods.  Talk with your health care provider about how much calcium you need each day. The amount of calcium you need each day depends on your age and overall health.  Nondairy foods that are high in calcium include: ? Sardines and canned salmon. ? Dried beans. ? Almonds. ? Turnip greens, collards, kale, and broccoli. ? Calcium-fortified soy milk and tofu. ? Calcium-fortified orange juice.  Talk with your  health care provider about vitamin and mineral supplements. Take supplements only as directed. Summary  Avoiding foods and drinks that contain lactose may help ease digestive problems such as diarrhea or stomach pain.  When you avoid milk and milk products, make sure to get calcium from other foods.  Take vitamin and mineral supplements only as directed by your health care provider. This information is not intended to replace advice  given to you by your health care provider. Make sure you discuss any questions you have with your health care provider. Document Revised: 11/14/2017 Document Reviewed: 03/14/2017 Elsevier Patient Education  2020 [employer].

## 2020-02-12 LAB — HIV ANTIBODY (ROUTINE TESTING W REFLEX): HIV Screen 4th Generation wRfx: NONREACTIVE

## 2020-02-12 LAB — HEPATITIS B SURFACE ANTIBODY, QUANTITATIVE: Hepatitis B Surf Ab Quant: 344.8 m[IU]/mL (ref >9.9)

## 2020-02-12 LAB — RPR: RPR Ser Ql: NONREACTIVE

## 2020-02-14 LAB — CYTOLOGY - PAP
Chlamydia: NEGATIVE
Comment: NEGATIVE
Comment: NORMAL
Diagnosis: NEGATIVE
Neisseria Gonorrhea: NEGATIVE

## 2020-02-29 ENCOUNTER — Telehealth: Payer: Self-pay | Admitting: Family Medicine

## 2020-02-29 NOTE — Telephone Encounter (Signed)
Lm message for pt to call bk about patch. Pt I to understand that she should use condoms if sexually active and ins will nit pay for the replacement

## 2020-02-29 NOTE — Telephone Encounter (Signed)
Pt called and states she lost her 3rd week of her patch of birth control and is requesting a refill for it today 3/16. [phone] Please advise.

## 2020-03-29 ENCOUNTER — Telehealth: Payer: Self-pay | Admitting: Family Medicine

## 2020-03-29 NOTE — Telephone Encounter (Signed)
Called pt  LVM for patinet to call back to schedule a TOC with another provider as  Her provider Dr Creta Levin is leaving our pracice

## 2020-04-28 ENCOUNTER — Ambulatory Visit: Payer: BC Managed Care – PPO | Admitting: Family Medicine

## 2020-05-12 ENCOUNTER — Other Ambulatory Visit: Payer: Self-pay

## 2020-05-12 ENCOUNTER — Other Ambulatory Visit (HOSPITAL_COMMUNITY)
Admission: RE | Admit: 2020-05-12 | Discharge: 2020-05-12 | Disposition: A | Discharge status: Home / Self Care | Payer: BLUE CROSS/BLUE SHIELD | Source: Ambulatory Visit | Attending: Registered Nurse | Admitting: Registered Nurse

## 2020-05-12 ENCOUNTER — Encounter: Payer: Self-pay | Admitting: Registered Nurse

## 2020-05-12 ENCOUNTER — Ambulatory Visit (INDEPENDENT_AMBULATORY_CARE_PROVIDER_SITE_OTHER): Payer: BC Managed Care – PPO | Admitting: Registered Nurse

## 2020-05-12 VITALS — BP 113/72 | HR 70 | Temp 98.0°F | Resp 16 | Ht 69.0 in | Wt 128.6 lb

## 2020-05-12 DIAGNOSIS — R110 Nausea: Secondary | ICD-10-CM

## 2020-05-12 DIAGNOSIS — Z130 Encounter for screening for diseases of the blood and blood-forming organs and certain disorders involving the immune mechanism: Secondary | ICD-10-CM

## 2020-05-12 DIAGNOSIS — Z13228 Encounter for screening for other metabolic disorders: Secondary | ICD-10-CM

## 2020-05-12 DIAGNOSIS — Z3009 Encounter for other general counseling and advice on contraception: Secondary | ICD-10-CM | POA: Diagnosis not present

## 2020-05-12 DIAGNOSIS — Z113 Encounter for screening for infections with a predominantly sexual mode of transmission: Secondary | ICD-10-CM | POA: Diagnosis not present

## 2020-05-12 DIAGNOSIS — Z1329 Encounter for screening for other suspected endocrine disorder: Secondary | ICD-10-CM | POA: Diagnosis not present

## 2020-05-12 LAB — POCT URINE PREGNANCY: Preg Test, Ur: NEGATIVE

## 2020-05-12 MED ORDER — XULANE 150-35 MCG/24HR TD PTWK: 1.0000 | MEDICATED_PATCH | TRANSDERMAL | 3 refills | Status: DC

## 2020-05-12 NOTE — Patient Instructions (Signed)
° ° ° °  If you have lab work done today you will be contacted with your lab results within the next 2 weeks.  If you have not heard from us then please contact us. The fastest way to get your results is to register for My Chart. ° ° °IF you received an x-ray today, you will receive an invoice from [facility]. Please contact [facility] at [phone] with questions or concerns regarding your invoice.  ° °IF you received labwork today, you will receive an invoice from [facility]. Please contact [facility] at [phone] with questions or concerns regarding your invoice.  ° °Our billing staff will not be able to assist you with questions regarding bills from these companies. ° °You will be contacted with the lab results as soon as they are available. The fastest way to get your results is to activate your My Chart account. Instructions are located on the last page of this paperwork. If you have not heard from us regarding the results in 2 weeks, please contact this office. °  ° ° ° °

## 2020-05-13 LAB — CBC WITH DIFFERENTIAL/PLATELET
Basophils Absolute: 0.1 10*3/uL (ref 0.0–0.2)
Basos: 1 %
EOS (ABSOLUTE): 0.1 10*3/uL (ref 0.0–0.4)
Eos: 4 %
Hematocrit: 39.5 % (ref 34.0–46.6)
Hemoglobin: 13.3 g/dL (ref 11.1–15.9)
Immature Grans (Abs): 0 10*3/uL (ref 0.0–0.1)
Immature Granulocytes: 0 %
Lymphocytes Absolute: 1.3 10*3/uL (ref 0.7–3.1)
Lymphs: 38 %
MCH: 31.7 pg (ref 26.6–33.0)
MCHC: 33.7 g/dL (ref 31.5–35.7)
MCV: 94 fL (ref 79–97)
Monocytes Absolute: 0.3 10*3/uL (ref 0.1–0.9)
Monocytes: 8 %
Neutrophils Absolute: 1.7 10*3/uL (ref 1.4–7.0)
Neutrophils: 49 %
Platelets: 412 10*3/uL (ref 150–450)
RBC: 4.19 x10E6/uL (ref 3.77–5.28)
RDW: 12 % (ref 11.7–15.4)
WBC: 3.5 10*3/uL (ref 3.4–10.8)

## 2020-05-13 LAB — COMPREHENSIVE METABOLIC PANEL
ALT: 9 IU/L (ref 0–32)
AST: 14 IU/L (ref 0–40)
Albumin/Globulin Ratio: 1.3 (ref 1.2–2.2)
Albumin: 4.3 g/dL (ref 3.9–5.0)
Alkaline Phosphatase: 63 IU/L (ref 48–121)
BUN/Creatinine Ratio: 13 (ref 9–23)
BUN: 10 mg/dL (ref 6–20)
Bilirubin Total: 0.5 mg/dL (ref 0.0–1.2)
CO2: 23 mmol/L (ref 20–29)
Calcium: 9.7 mg/dL (ref 8.7–10.2)
Chloride: 101 mmol/L (ref 96–106)
Creatinine, Ser: 0.75 mg/dL (ref 0.57–1.00)
GFR calc Af Amer: 125 mL/min/{1.73_m2} (ref >59)
GFR calc non Af Amer: 109 mL/min/{1.73_m2} (ref >59)
Globulin, Total: 3.2 g/dL (ref 1.5–4.5)
Glucose: 68 mg/dL (ref 65–99)
Potassium: 4.2 mmol/L (ref 3.5–5.2)
Sodium: 138 mmol/L (ref 134–144)
Total Protein: 7.5 g/dL (ref 6.0–8.5)

## 2020-05-13 LAB — RPR: RPR Ser Ql: NONREACTIVE

## 2020-05-13 LAB — HIV ANTIBODY (ROUTINE TESTING W REFLEX): HIV Screen 4th Generation wRfx: NONREACTIVE

## 2020-05-17 ENCOUNTER — Encounter: Payer: Self-pay | Admitting: Registered Nurse

## 2020-05-17 LAB — URINE CYTOLOGY ANCILLARY ONLY
Chlamydia: NEGATIVE
Comment: NEGATIVE
Comment: NEGATIVE
Comment: NORMAL
Neisseria Gonorrhea: NEGATIVE
Trichomonas: NEGATIVE

## 2020-05-17 NOTE — Progress Notes (Signed)
Established Patient Office Visit  Subjective:  Patient ID: Pamela Gallegos, female    DOB: Apr 13, 1991  Age: 29 y.o. MRN: [id number]  CC:  Chief Complaint  Patient presents with  . Transitions Of Care    patient states she got the Covid vaccine and is experiencing nausea and headaches from one week ago. Per patient she has not took anything because they headache comes and goes . Would also like to discuss the patch birth control     HPI Pamela Gallegos presents for nausea.   Had COVID vaccine around one week ago. Headaches and nausea intermittently since. Takes tylenol prn with good effect. Still maintaining appetite. No vomiting or diarrhea. No respiratory symptoms or other symptoms of COVID  Also wishes to discuss BCM. On patch - unfortunately off-brand of patch does not adhere as well. Going through many more patches as a result, cannot continue this. Requests to be switched back to [employer] brand, which has worked very well in the past.  Requesting STD screen. No symptoms or known exposures, endorses new partner within 90 days.   No other concerns at this time, feeling well.   Past Medical History:  Diagnosis Date  . Depression    Phreesia 05/10/2020  . Eczema     No past surgical history on file.  Family History  Problem Relation Age of Onset  . Hyperlipidemia Mother   . Hypertension Mother     Social History   Socioeconomic History  . Marital status: Single    Spouse name: Not on file  . Number of children: Not on file  . Years of education: Not on file  . Highest education level: Not on file  Occupational History  . Not on file  Tobacco Use  . Smoking status: Never Smoker  . Smokeless tobacco: Never Used  Substance and Sexual Activity  . Alcohol use: No  . Drug use: No  . Sexual activity: Never  Other Topics Concern  . Not on file  Social History Narrative  . Not on file   Social Determinants of Health   Financial Resource Strain:   .  Difficulty of Paying Living Expenses:   Food Insecurity:   . Worried About Programme researcher, broadcasting/film/video in the Last Year:   . Barista in the Last Year:   Transportation Needs:   . Freight forwarder (Medical):   Marland Kitchen Lack of Transportation (Non-Medical):   Physical Activity:   . Days of Exercise per Week:   . Minutes of Exercise per Session:   Stress:   . Feeling of Stress :   Social Connections:   . Frequency of Communication with Friends and Family:   . Frequency of Social Gatherings with Friends and Family:   . Attends Religious Services:   . Active Member of Clubs or Organizations:   . Attends Banker Meetings:   Marland Kitchen Marital Status:   Intimate Partner Violence:   . Fear of Current or Ex-Partner:   . Emotionally Abused:   Marland Kitchen Physically Abused:   . Sexually Abused:     Outpatient Medications Prior to Visit  Medication Sig Dispense Refill  . ibuprofen (ADVIL,MOTRIN) 600 MG tablet Take 1 tablet (600 mg total) by mouth every 8 (eight) hours as needed. 30 tablet 0  . triamcinolone cream (KENALOG) 0.1 % Apply 1 application topically 2 (two) times daily. 30 g 1  . norelgestromin-ethinyl estradiol Burr Medico) 150-35 MCG/24HR transdermal patch Place 1 patch onto the  skin once a week. 3 patch 12   No facility-administered medications prior to visit.    Allergies  Allergen Reactions  . Penicillins     ROS Review of Systems  Constitutional: Negative.   HENT: Negative.   Eyes: Negative.   Respiratory: Negative.   Cardiovascular: Negative.   Gastrointestinal: Positive for nausea. Negative for abdominal distention, abdominal pain, anal bleeding, blood in stool, constipation, diarrhea, rectal pain and vomiting.  Endocrine: Negative.   Genitourinary: Negative.   Musculoskeletal: Negative.   Skin: Negative.   Allergic/Immunologic: Negative.   Neurological: Negative.   Hematological: Negative.   Psychiatric/Behavioral: Negative.   All other systems reviewed and are  negative.     Objective:    Physical Exam  BP 113/72   Pulse 70   Temp 98 F (36.7 C) (Temporal)   Resp 16   Ht 5\' 9"  (1.753 m)   Wt 128 lb 9.6 oz (58.3 kg)   SpO2 98%   BMI 18.99 kg/m  Wt Readings from Last 3 Encounters:  05/12/20 128 lb 9.6 oz (58.3 kg)  02/11/20 133 lb 3.2 oz (60.4 kg)  10/22/19 136 lb (61.7 kg)     There are no preventive care reminders to display for this patient.  There are no preventive care reminders to display for this patient.  No results found for: TSH Lab Results  Component Value Date   WBC 3.5 05/12/2020   HGB 13.3 05/12/2020   HCT 39.5 05/12/2020   MCV 94 05/12/2020   PLT 412 05/12/2020   Lab Results  Component Value Date   NA 138 05/12/2020   K 4.2 05/12/2020   CO2 23 05/12/2020   GLUCOSE 68 05/12/2020   BUN 10 05/12/2020   CREATININE 0.75 05/12/2020   BILITOT 0.5 05/12/2020   ALKPHOS 63 05/12/2020   AST 14 05/12/2020   ALT 9 05/12/2020   PROT 7.5 05/12/2020   ALBUMIN 4.3 05/12/2020   CALCIUM 9.7 05/12/2020   No results found for: CHOL No results found for: HDL No results found for: LDLCALC No results found for: TRIG No results found for: CHOLHDL No results found for: HGBA1C    Assessment & Plan:   Problem List Items Addressed This Visit    None    Visit Diagnoses    Birth control counseling    -  Primary   Relevant Medications   norelgestromin-ethinyl estradiol Marilu Favre) 150-35 MCG/24HR transdermal patch   Other Relevant Orders   POCT urine pregnancy (Completed)   Screen for STD (sexually transmitted disease)       Relevant Orders   RPR (Completed)   Urine cytology ancillary only   HIV antibody (with reflex) (Completed)   Screening for endocrine, metabolic and immunity disorder       Relevant Orders   Comprehensive metabolic panel (Completed)   CBC with Differential (Completed)      Meds ordered this encounter  Medications  . norelgestromin-ethinyl estradiol Marilu Favre) 150-35 MCG/24HR transdermal  patch    Sig: Place 1 patch onto the skin once a week.    Dispense:  12 patch    Refill:  3    Order Specific Question:   Supervising Provider    Answer:   Forrest Moron [id number]    Follow-up: No follow-ups on file.   PLAN  Rx for Xulane brand written  Labs collected, will follow up as warranted  Encouraged adequate hydration, adequate po intake, continue tylenol, symptoms from vaccine will likely resolve shortly  Patient encouraged  to call clinic with any questions, comments, or concerns.  Maximiano Coss, NP

## 2020-06-10 ENCOUNTER — Encounter (HOSPITAL_COMMUNITY): Payer: Self-pay | Admitting: Emergency Medicine

## 2020-06-10 ENCOUNTER — Emergency Department (HOSPITAL_COMMUNITY)
Admission: EM | Admit: 2020-06-10 | Discharge: 2020-06-10 | Disposition: A | Discharge status: Home / Self Care | Payer: Medicaid Other | Attending: Emergency Medicine | Admitting: Emergency Medicine

## 2020-06-10 ENCOUNTER — Emergency Department (HOSPITAL_COMMUNITY): Payer: Medicaid Other

## 2020-06-10 ENCOUNTER — Other Ambulatory Visit: Payer: Self-pay

## 2020-06-10 DIAGNOSIS — W260XXA Contact with knife, initial encounter: Secondary | ICD-10-CM | POA: Insufficient documentation

## 2020-06-10 DIAGNOSIS — Y9289 Other specified places as the place of occurrence of the external cause: Secondary | ICD-10-CM | POA: Insufficient documentation

## 2020-06-10 DIAGNOSIS — Y999 Unspecified external cause status: Secondary | ICD-10-CM | POA: Insufficient documentation

## 2020-06-10 DIAGNOSIS — Y939 Activity, unspecified: Secondary | ICD-10-CM | POA: Insufficient documentation

## 2020-06-10 DIAGNOSIS — S61213A Laceration without foreign body of left middle finger without damage to nail, initial encounter: Secondary | ICD-10-CM

## 2020-06-10 MED ORDER — LIDOCAINE HCL (PF) 1 % IJ SOLN
20.0000 mL | Freq: Once | INTRAMUSCULAR | Status: AC
  Administered 2020-06-10: 20 mL
  Filled 2020-06-10: qty 30

## 2020-06-10 MED ORDER — TETANUS-DIPHTH-ACELL PERTUSSIS 5-2.5-18.5 LF-MCG/0.5 IM SUSP
0.5000 mL | Freq: Once | INTRAMUSCULAR | Status: DC
  Filled 2020-06-10: qty 0.5

## 2020-06-10 NOTE — Discharge Instructions (Addendum)
Follow-up with primary doctor in 1 week for wound recheck and suture removal.  Keep area clean and dry, can use antibiotic ointment.  If you develop numbness, weakness, redness, purulence, return to ER for reassessment.

## 2020-06-10 NOTE — ED Triage Notes (Addendum)
Patient laceration to left middle finger with kitchen knife. Movement and sensation present. Unknown last tetanus shot.

## 2020-06-10 NOTE — ED Provider Notes (Signed)
[facility]-EMERGENCY DEPT Provider Note   CSN: [id number] Arrival date & time: 06/10/20  1909     History Chief Complaint  Patient presents with  . Laceration    Pamela Gallegos is a 29 y.o. female.  Presents with laceration of left middle finger.  Reports that she was chopping garlic with large kitchen knife when she accidentally caused laceration to her left middle finger.  She is right-hand dominant.  Bleeding stopped with direct pressure.  No numbness or weakness.  Denies any medical problems, no blood thinners. HPI     Past Medical History:  Diagnosis Date  . Depression    Phreesia 05/10/2020  . Eczema     There are no problems to display for this patient.   History reviewed. No pertinent surgical history.   OB History   No obstetric history on file.     Family History  Problem Relation Age of Onset  . Hyperlipidemia Mother   . Hypertension Mother     Social History   Tobacco Use  . Smoking status: Never Smoker  . Smokeless tobacco: Never Used  Substance Use Topics  . Alcohol use: No  . Drug use: No    Home Medications Prior to Admission medications   Medication Sig Start Date End Date Taking? Authorizing Provider  ibuprofen (ADVIL,MOTRIN) 600 MG tablet Take 1 tablet (600 mg total) by mouth every 8 (eight) hours as needed. 09/06/17   Doristine Bosworth, MD  norelgestromin-ethinyl estradiol Burr Medico) 150-35 MCG/24HR transdermal patch Place 1 patch onto the skin once a week. 05/12/20   Janeece Agee, NP  triamcinolone cream (KENALOG) 0.1 % Apply 1 application topically 2 (two) times daily. 02/11/20   Doristine Bosworth, MD    Allergies    Penicillins  Review of Systems   Review of Systems  Constitutional: Negative for chills and fever.  HENT: Negative for ear pain and sore throat.   Eyes: Negative for pain and visual disturbance.  Respiratory: Negative for cough and shortness of breath.   Cardiovascular: Negative for chest pain  and palpitations.  Gastrointestinal: Negative for abdominal pain and vomiting.  Genitourinary: Negative for dysuria and hematuria.  Musculoskeletal: Negative for arthralgias and back pain.  Skin: Negative for color change and rash.  Neurological: Negative for seizures and syncope.  All other systems reviewed and are negative.   Physical Exam Updated Vital Signs BP 127/85   Pulse (!) 103   Temp 98.4 F (36.9 C) (Oral)   Resp 20   LMP 06/01/2020   SpO2 100%   Physical Exam Vitals and nursing note reviewed.  Constitutional:      Appearance: Normal appearance. She is not ill-appearing.  HENT:     Head: Normocephalic and atraumatic.     Nose: Nose normal.  Cardiovascular:     Rate and Rhythm: Normal rate.     Pulses: Normal pulses.  Pulmonary:     Effort: Pulmonary effort is normal. No respiratory distress.  Musculoskeletal:        General: No deformity.     Cervical back: Neck supple. No rigidity.     Comments: LUE: L middle finger 1cm laceration to volar aspect of distal finger, no involvement of fingernail, normal cap refill, normal flexion/extension, normal sensation to light touch throughout finger  Skin:    General: Skin is warm and dry.     Capillary Refill: Capillary refill takes less than 2 seconds.  Neurological:     General: No focal deficit  present.     Mental Status: She is alert.  Psychiatric:        Mood and Affect: Mood normal.        Behavior: Behavior normal.     ED Results / Procedures / Treatments   Labs (all labs ordered are listed, but only abnormal results are displayed) Labs Reviewed - No data to display  EKG None  Radiology DG Finger Middle Left  Result Date: 06/10/2020 CLINICAL DATA:  Finger laceration EXAM: LEFT MIDDLE FINGER 2+V COMPARISON:  None. FINDINGS: There is no evidence of fracture or dislocation. There is no evidence of arthropathy or other focal bone abnormality. Soft tissues are unremarkable. IMPRESSION: Negative.  Electronically Signed   By: Charlett Nose M.D.   On: 06/10/2020 19:46    Procedures .Marland KitchenLaceration Repair  Date/Time: 06/10/2020 8:11 PM Performed by: Milagros Loll, MD Authorized by: Milagros Loll, MD   Consent:    Consent obtained:  Verbal   Consent given by:  Patient   Risks discussed:  Infection, need for additional repair and nerve damage   Alternatives discussed:  No treatment, delayed treatment and observation Anesthesia (see MAR for exact dosages):    Anesthesia method:  Local infiltration   Local anesthetic:  Lidocaine 1% w/o epi Laceration details:    Location:  Finger   Finger location:  L long finger   Length (cm):  1 Repair type:    Repair type:  Simple Exploration:    Contaminated: no   Treatment:    Area cleansed with:  Betadine   Amount of cleaning:  Extensive   Irrigation solution:  Sterile saline   Irrigation method:  Syringe Skin repair:    Repair method:  Sutures   Suture size:  5-0   Suture material:  Nylon   Suture technique:  Simple interrupted   Number of sutures:  2 Approximation:    Approximation:  Close Post-procedure details:    Dressing:  Tube gauze   Patient tolerance of procedure:  Tolerated well, no immediate complications   (including critical care time)  Medications Ordered in ED Medications  Tdap (BOOSTRIX) injection 0.5 mL (0 mLs Intramuscular Hold 06/10/20 2007)  lidocaine (PF) (XYLOCAINE) 1 % injection 20 mL (20 mLs Infiltration Given 06/10/20 2007)    ED Course  I have reviewed the triage vital signs and the nursing notes.  Pertinent labs & imaging results that were available during my care of the patient were reviewed by me and considered in my medical decision making (see chart for details).    MDM Rules/Calculators/A&P                          29 year old lady presenting to ER with simple laceration to her left middle finger.  No fingernail involvement.  Neurovascularly intact.  X-ray negative.  Performed  laceration repair, recommend PCP recheck in 1 week for suture removal.  After the discussed management above, the patient was determined to be safe for discharge.  The patient was in agreement with this plan and all questions regarding their care were answered.  ED return precautions were discussed and the patient will return to the ED with any significant worsening of condition.  Final Clinical Impression(s) / ED Diagnoses Final diagnoses:  Laceration of left middle finger without foreign body without damage to nail, initial encounter    Rx / DC Orders ED Discharge Orders    None       Zayvien Canning,  Ellwood Dense, MD 06/10/20 2257

## 2020-06-12 ENCOUNTER — Telehealth: Payer: Self-pay | Admitting: Family Medicine

## 2020-06-12 NOTE — Telephone Encounter (Signed)
Pt called back and sch appt for 06/16/20

## 2020-06-12 NOTE — Telephone Encounter (Signed)
A [facility] Nurse called, that had a pt on the other line wanting to get stiches out from a vist pt had at the ED. Spoke with nurses here and they stated that they would need to get the stiches removed from the place that pt received the stiches. Nurse was going to transfer so I could relay the message, but pt had hung up. Called pt and LVMTCB to let pt know. Please advise.

## 2020-06-13 ENCOUNTER — Encounter: Payer: Self-pay | Admitting: Registered Nurse

## 2020-06-16 ENCOUNTER — Other Ambulatory Visit: Payer: Self-pay

## 2020-06-16 ENCOUNTER — Encounter: Payer: Self-pay | Admitting: Registered Nurse

## 2020-06-16 ENCOUNTER — Ambulatory Visit (INDEPENDENT_AMBULATORY_CARE_PROVIDER_SITE_OTHER): Payer: BC Managed Care – PPO | Admitting: Registered Nurse

## 2020-06-16 VITALS — BP 119/68 | HR 58 | Temp 97.7°F | Resp 18 | Ht 69.0 in | Wt 124.0 lb

## 2020-06-16 DIAGNOSIS — S61205A Unspecified open wound of left ring finger without damage to nail, initial encounter: Secondary | ICD-10-CM

## 2020-06-16 DIAGNOSIS — Z4802 Encounter for removal of sutures: Secondary | ICD-10-CM | POA: Diagnosis not present

## 2020-06-16 NOTE — Progress Notes (Signed)
Acute Office Visit  Subjective:    Patient ID: Pamela Gallegos, female    DOB: 1991-11-09, 29 y.o.   MRN: [id number]  Chief Complaint  Patient presents with  . Suture / Staple Removal    Patient states she was cutting garlic and had to go the hospital and get a stitch and its now ready for removal.    HPI Patient is in today for suture removal.  About one week ago cut her third digit of l hand when cutting garlic. Went to ED and had two sutures placed. Wound appears to be healing well, no sxs of infection Suture removal today  Past Medical History:  Diagnosis Date  . Depression    Phreesia 05/10/2020  . Eczema     History reviewed. No pertinent surgical history.  Family History  Problem Relation Age of Onset  . Hyperlipidemia Mother   . Hypertension Mother     Social History   Socioeconomic History  . Marital status: Single    Spouse name: Not on file  . Number of children: Not on file  . Years of education: Not on file  . Highest education level: Not on file  Occupational History  . Not on file  Tobacco Use  . Smoking status: Never Smoker  . Smokeless tobacco: Never Used  Substance and Sexual Activity  . Alcohol use: No  . Drug use: No  . Sexual activity: Never  Other Topics Concern  . Not on file  Social History Narrative  . Not on file   Social Determinants of Health   Financial Resource Strain:   . Difficulty of Paying Living Expenses:   Food Insecurity:   . Worried About Programme researcher, broadcasting/film/video in the Last Year:   . Barista in the Last Year:   Transportation Needs:   . Freight forwarder (Medical):   Marland Kitchen Lack of Transportation (Non-Medical):   Physical Activity:   . Days of Exercise per Week:   . Minutes of Exercise per Session:   Stress:   . Feeling of Stress :   Social Connections:   . Frequency of Communication with Friends and Family:   . Frequency of Social Gatherings with Friends and Family:   . Attends Religious Services:    . Active Member of Clubs or Organizations:   . Attends Banker Meetings:   Marland Kitchen Marital Status:   Intimate Partner Violence:   . Fear of Current or Ex-Partner:   . Emotionally Abused:   Marland Kitchen Physically Abused:   . Sexually Abused:     Outpatient Medications Prior to Visit  Medication Sig Dispense Refill  . ibuprofen (ADVIL,MOTRIN) 600 MG tablet Take 1 tablet (600 mg total) by mouth every 8 (eight) hours as needed. 30 tablet 0  . norelgestromin-ethinyl estradiol Burr Medico) 150-35 MCG/24HR transdermal patch Place 1 patch onto the skin once a week. 12 patch 3  . triamcinolone cream (KENALOG) 0.1 % Apply 1 application topically 2 (two) times daily. 30 g 1   No facility-administered medications prior to visit.    Allergies  Allergen Reactions  . Penicillins     Review of Systems Per hpi     Objective:    Physical Exam Skin:    General: Skin is warm and dry.     Capillary Refill: Capillary refill takes less than 2 seconds.     Coloration: Skin is not jaundiced or pale.     Findings: Lesion (two sutures with well  approximated and healing wound on tip of third digit of L hand) present. No bruising, erythema or rash.  Neurological:     General: No focal deficit present.     Mental Status: She is oriented to person, place, and time. Mental status is at baseline.  Psychiatric:        Mood and Affect: Mood normal.        Behavior: Behavior normal.        Thought Content: Thought content normal.        Judgment: Judgment normal.     BP 119/68   Pulse (!) 58   Temp 97.7 F (36.5 C) (Temporal)   Resp 18   Ht 5\' 9"  (1.753 m)   Wt 124 lb (56.2 kg)   LMP 06/01/2020   SpO2 99%   BMI 18.31 kg/m  Wt Readings from Last 3 Encounters:  06/16/20 124 lb (56.2 kg)  05/12/20 128 lb 9.6 oz (58.3 kg)  02/11/20 133 lb 3.2 oz (60.4 kg)    There are no preventive care reminders to display for this patient.  There are no preventive care reminders to display for this  patient.   No results found for: TSH Lab Results  Component Value Date   WBC 3.5 05/12/2020   HGB 13.3 05/12/2020   HCT 39.5 05/12/2020   MCV 94 05/12/2020   PLT 412 05/12/2020   Lab Results  Component Value Date   NA 138 05/12/2020   K 4.2 05/12/2020   CO2 23 05/12/2020   GLUCOSE 68 05/12/2020   BUN 10 05/12/2020   CREATININE 0.75 05/12/2020   BILITOT 0.5 05/12/2020   ALKPHOS 63 05/12/2020   AST 14 05/12/2020   ALT 9 05/12/2020   PROT 7.5 05/12/2020   ALBUMIN 4.3 05/12/2020   CALCIUM 9.7 05/12/2020   No results found for: CHOL No results found for: HDL No results found for: LDLCALC No results found for: TRIG No results found for: CHOLHDL No results found for: 05/14/2020     Assessment & Plan:   Problem List Items Addressed This Visit    None    Visit Diagnoses    Visit for suture removal    -  Primary       No orders of the defined types were placed in this encounter.  PLAN  Sutures removed   No concerns for infection  Return prn  Patient encouraged to call clinic with any questions, comments, or concerns.  [id number], NP

## 2020-06-16 NOTE — Patient Instructions (Signed)
° ° ° °  If you have lab work done today you will be contacted with your lab results within the next 2 weeks.  If you have not heard from us then please contact us. The fastest way to get your results is to register for My Chart. ° ° °IF you received an x-ray today, you will receive an invoice from [facility]. Please contact [facility] at [phone] with questions or concerns regarding your invoice.  ° °IF you received labwork today, you will receive an invoice from [facility]. Please contact [facility] at [phone] with questions or concerns regarding your invoice.  ° °Our billing staff will not be able to assist you with questions regarding bills from these companies. ° °You will be contacted with the lab results as soon as they are available. The fastest way to get your results is to activate your My Chart account. Instructions are located on the last page of this paperwork. If you have not heard from us regarding the results in 2 weeks, please contact this office. °  ° ° ° °

## 2020-08-11 ENCOUNTER — Encounter: Payer: Self-pay | Admitting: Registered Nurse

## 2020-08-11 ENCOUNTER — Other Ambulatory Visit (HOSPITAL_COMMUNITY)
Admission: RE | Admit: 2020-08-11 | Discharge: 2020-08-11 | Disposition: A | Discharge status: Home / Self Care | Payer: BC Managed Care – PPO | Source: Ambulatory Visit | Attending: Registered Nurse | Admitting: Registered Nurse

## 2020-08-11 ENCOUNTER — Other Ambulatory Visit: Payer: Self-pay

## 2020-08-11 ENCOUNTER — Ambulatory Visit (INDEPENDENT_AMBULATORY_CARE_PROVIDER_SITE_OTHER): Payer: BC Managed Care – PPO | Admitting: Registered Nurse

## 2020-08-11 VITALS — BP 110/72 | HR 61 | Temp 97.9°F | Resp 18 | Ht 69.0 in | Wt 130.8 lb

## 2020-08-11 DIAGNOSIS — B373 Candidiasis of vulva and vagina: Secondary | ICD-10-CM | POA: Diagnosis not present

## 2020-08-11 DIAGNOSIS — Z113 Encounter for screening for infections with a predominantly sexual mode of transmission: Secondary | ICD-10-CM

## 2020-08-11 DIAGNOSIS — B3731 Acute candidiasis of vulva and vagina: Secondary | ICD-10-CM

## 2020-08-11 DIAGNOSIS — H5789 Other specified disorders of eye and adnexa: Secondary | ICD-10-CM

## 2020-08-11 MED ORDER — AZELASTINE HCL 0.05 % OP SOLN: 1.0000 [drp] | Freq: Two times a day (BID) | OPHTHALMIC | 12 refills | Status: AC

## 2020-08-11 MED ORDER — FLUCONAZOLE 150 MG PO TABS: 150.0000 mg | ORAL_TABLET | Freq: Once | ORAL | 0 refills | Status: AC

## 2020-08-11 NOTE — Patient Instructions (Signed)
° ° ° °  If you have lab work done today you will be contacted with your lab results within the next 2 weeks.  If you have not heard from us then please contact us. The fastest way to get your results is to register for My Chart. ° ° °IF you received an x-ray today, you will receive an invoice from [facility]. Please contact [facility] at [phone] with questions or concerns regarding your invoice.  ° °IF you received labwork today, you will receive an invoice from [facility]. Please contact [facility] at [phone] with questions or concerns regarding your invoice.  ° °Our billing staff will not be able to assist you with questions regarding bills from these companies. ° °You will be contacted with the lab results as soon as they are available. The fastest way to get your results is to activate your My Chart account. Instructions are located on the last page of this paperwork. If you have not heard from us regarding the results in 2 weeks, please contact this office. °  ° ° ° °

## 2020-08-11 NOTE — Progress Notes (Signed)
Established Patient Office Visit  Subjective:  Patient ID: Pamela Gallegos, female    DOB: 04/26/1991  Age: 29 y.o. MRN: [id number]  CC:  Chief Complaint  Patient presents with   Follow-up    patient states she is here for routine check up for STD and to discuss right eye watering alot and making her vision a little blurry    HPI Pamela Gallegos presents for routine STD check and watery eye  STD screening: routine, no symptoms, no known exposure, no recent tx for STI  Watery eye: Right eye only. Ongoing for 3-4 weeks. Not worsening or improving. No purulent drainage, pain, changes to visual acuity. Works in [employer] with much dust and particles in air.   Curd like discharge: recently started macrobid for UTI. Concerned that she usually gets candidal infection with abx use. Requesting diflucan   No other concerns or complaints. Feeling well overall.   Past Medical History:  Diagnosis Date   Depression    Phreesia 05/10/2020   Eczema     No past surgical history on file.  Family History  Problem Relation Age of Onset   Hyperlipidemia Mother    Hypertension Mother     Social History   Socioeconomic History   Marital status: Single    Spouse name: Not on file   Number of children: Not on file   Years of education: Not on file   Highest education level: Not on file  Occupational History   Not on file  Tobacco Use   Smoking status: Never Smoker   Smokeless tobacco: Never Used  Substance and Sexual Activity   Alcohol use: No   Drug use: No   Sexual activity: Never  Other Topics Concern   Not on file  Social History Narrative   Not on file   Social Determinants of Health   Financial Resource Strain:    Difficulty of Paying Living Expenses: Not on file  Food Insecurity:    Worried About Running Out of Food in the Last Year: Not on file   Ran Out of Food in the Last Year: Not on file  Transportation Needs:    Lack of  Transportation (Medical): Not on file   Lack of Transportation (Non-Medical): Not on file  Physical Activity:    Days of Exercise per Week: Not on file   Minutes of Exercise per Session: Not on file  Stress:    Feeling of Stress : Not on file  Social Connections:    Frequency of Communication with Friends and Family: Not on file   Frequency of Social Gatherings with Friends and Family: Not on file   Attends Religious Services: Not on file   Active Member of Clubs or Organizations: Not on file   Attends Banker Meetings: Not on file   Marital Status: Not on file  Intimate Partner Violence:    Fear of Current or Ex-Partner: Not on file   Emotionally Abused: Not on file   Physically Abused: Not on file   Sexually Abused: Not on file    Outpatient Medications Prior to Visit  Medication Sig Dispense Refill   ibuprofen (ADVIL,MOTRIN) 600 MG tablet Take 1 tablet (600 mg total) by mouth every 8 (eight) hours as needed. 30 tablet 0   norelgestromin-ethinyl estradiol [facility]) 150-35 MCG/24HR transdermal patch Place 1 patch onto the skin once a week. 12 patch 3   triamcinolone cream (KENALOG) 0.1 % Apply 1 application topically 2 (two) times daily.  30 g 1   No facility-administered medications prior to visit.    Allergies  Allergen Reactions   Penicillins     ROS Review of Systems  Constitutional: Negative.   HENT: Negative.   Eyes: Positive for itching.  Respiratory: Negative.   Cardiovascular: Negative.   Gastrointestinal: Negative.   Endocrine: Negative.   Genitourinary: Positive for vaginal discharge.  Musculoskeletal: Negative.   Skin: Negative.   Allergic/Immunologic: Negative.   Neurological: Negative.   Hematological: Negative.   Psychiatric/Behavioral: Negative.   All other systems reviewed and are negative.     Objective:    Physical Exam Vitals and nursing note reviewed.  Constitutional:      General: She is not in acute  distress.    Appearance: Normal appearance. She is normal weight. She is not ill-appearing, toxic-appearing or diaphoretic.  Eyes:     General: Lids are normal. Lids are everted, no foreign bodies appreciated. Vision grossly intact. Gaze aligned appropriately. No allergic shiner, visual field deficit or scleral icterus.       Right eye: Hordeolum (question of very mild hordeolum forming.) present. No foreign body or discharge.        Left eye: No foreign body, discharge or hordeolum.     Extraocular Movements: Extraocular movements intact.     Conjunctiva/sclera: Conjunctivae normal.     Right eye: Right conjunctiva is not injected. No chemosis, exudate or hemorrhage.    Left eye: Left conjunctiva is not injected. No chemosis, exudate or hemorrhage. Cardiovascular:     Rate and Rhythm: Normal rate and regular rhythm.  Pulmonary:     Effort: Pulmonary effort is normal. No respiratory distress.  Skin:    General: Skin is warm and dry.  Neurological:     General: No focal deficit present.     Mental Status: She is alert and oriented to person, place, and time. Mental status is at baseline.  Psychiatric:        Mood and Affect: Mood normal.        Behavior: Behavior normal.        Thought Content: Thought content normal.        Judgment: Judgment normal.     BP 110/72    Pulse 61    Temp 97.9 F (36.6 C) (Temporal)    Resp 18    Ht 5\' 9"  (1.753 m)    Wt 130 lb 12.8 oz (59.3 kg)    SpO2 97%    BMI 19.32 kg/m  Wt Readings from Last 3 Encounters:  08/11/20 130 lb 12.8 oz (59.3 kg)  06/16/20 124 lb (56.2 kg)  05/12/20 128 lb 9.6 oz (58.3 kg)     Health Maintenance Due  Topic Date Due   INFLUENZA VACCINE  07/16/2020    There are no preventive care reminders to display for this patient.  No results found for: TSH Lab Results  Component Value Date   WBC 3.5 05/12/2020   HGB 13.3 05/12/2020   HCT 39.5 05/12/2020   MCV 94 05/12/2020   PLT 412 05/12/2020   Lab Results    Component Value Date   NA 138 05/12/2020   K 4.2 05/12/2020   CO2 23 05/12/2020   GLUCOSE 68 05/12/2020   BUN 10 05/12/2020   CREATININE 0.75 05/12/2020   BILITOT 0.5 05/12/2020   ALKPHOS 63 05/12/2020   AST 14 05/12/2020   ALT 9 05/12/2020   PROT 7.5 05/12/2020   ALBUMIN 4.3 05/12/2020   CALCIUM 9.7 05/12/2020  No results found for: CHOL No results found for: HDL No results found for: LDLCALC No results found for: TRIG No results found for: CHOLHDL No results found for: UVOZ3G    Assessment & Plan:   Problem List Items Addressed This Visit    None    Visit Diagnoses    Screen for STD (sexually transmitted disease)    -  Primary   Relevant Orders   HIV antibody (with reflex)   RPR   Urine cytology ancillary only   Irritation of right eye       Relevant Medications   azelastine (OPTIVAR) 0.05 % ophthalmic solution   Vaginal candidiasis       Relevant Medications   fluconazole (DIFLUCAN) 150 MG tablet      Meds ordered this encounter  Medications   azelastine (OPTIVAR) 0.05 % ophthalmic solution    Sig: Place 1 drop into the right eye 2 (two) times daily.    Dispense:  6 mL    Refill:  12    Order Specific Question:   Supervising Provider    Answer:   Neva Seat, JEFFREY R [[id number]]   fluconazole (DIFLUCAN) 150 MG tablet    Sig: Take 1 tablet (150 mg total) by mouth once for 1 dose.    Dispense:  1 tablet    Refill:  0    Order Specific Question:   Supervising Provider    Answer:   Neva Seat, JEFFREY R [[id number]]    Follow-up: No follow-ups on file.   PLAN  STD screening collected, will follow up as warranted  Azelastine and hot compresses for eye irritation. Discussed reasons to rtc  Diflucan 150mg  PO once for candidal infection  Patient encouraged to call clinic with any questions, comments, or concerns.  , NP

## 2020-08-12 LAB — HIV ANTIBODY (ROUTINE TESTING W REFLEX): HIV Screen 4th Generation wRfx: NONREACTIVE

## 2020-08-12 LAB — RPR: RPR Ser Ql: NONREACTIVE

## 2020-08-14 LAB — URINE CYTOLOGY ANCILLARY ONLY
Chlamydia: NEGATIVE
Comment: NEGATIVE
Comment: NEGATIVE
Comment: NORMAL
Neisseria Gonorrhea: NEGATIVE
Trichomonas: NEGATIVE

## 2020-10-06 ENCOUNTER — Ambulatory Visit (INDEPENDENT_AMBULATORY_CARE_PROVIDER_SITE_OTHER): Payer: BC Managed Care – PPO | Admitting: Registered Nurse

## 2020-10-06 ENCOUNTER — Other Ambulatory Visit: Payer: Self-pay

## 2020-10-06 ENCOUNTER — Other Ambulatory Visit (HOSPITAL_COMMUNITY)
Admission: RE | Admit: 2020-10-06 | Discharge: 2020-10-06 | Disposition: A | Discharge status: Home / Self Care | Payer: BC Managed Care – PPO | Source: Ambulatory Visit | Attending: Registered Nurse | Admitting: Registered Nurse

## 2020-10-06 ENCOUNTER — Encounter: Payer: Self-pay | Admitting: Registered Nurse

## 2020-10-06 VITALS — BP 115/78 | HR 73 | Temp 98.0°F | Resp 15 | Ht 69.0 in | Wt 134.8 lb

## 2020-10-06 DIAGNOSIS — N898 Other specified noninflammatory disorders of vagina: Secondary | ICD-10-CM | POA: Diagnosis not present

## 2020-10-06 DIAGNOSIS — Z3009 Encounter for other general counseling and advice on contraception: Secondary | ICD-10-CM | POA: Diagnosis not present

## 2020-10-06 LAB — POCT URINALYSIS DIP (MANUAL ENTRY)
Bilirubin, UA: NEGATIVE
Glucose, UA: NEGATIVE mg/dL
Ketones, POC UA: NEGATIVE mg/dL
Leukocytes, UA: NEGATIVE
Nitrite, UA: NEGATIVE
Protein Ur, POC: NEGATIVE mg/dL
Spec Grav, UA: 1.02 (ref 1.010–1.025)
Urobilinogen, UA: 0.2 E.U./dL
pH, UA: 6 (ref 5.0–8.0)

## 2020-10-06 LAB — POCT WET + KOH PREP
Trich by wet prep: ABSENT
Yeast by KOH: ABSENT
Yeast by wet prep: ABSENT

## 2020-10-06 MED ORDER — METRONIDAZOLE 500 MG PO TABS: 500.0000 mg | ORAL_TABLET | Freq: Two times a day (BID) | ORAL | 0 refills | Status: AC

## 2020-10-06 MED ORDER — FLUCONAZOLE 150 MG PO TABS: 150.0000 mg | ORAL_TABLET | Freq: Once | ORAL | 0 refills | Status: AC

## 2020-10-06 MED ORDER — XULANE 150-35 MCG/24HR TD PTWK: 1.0000 | MEDICATED_PATCH | TRANSDERMAL | 3 refills | Status: DC

## 2020-10-06 NOTE — Patient Instructions (Signed)
° ° ° °  If you have lab work done today you will be contacted with your lab results within the next 2 weeks.  If you have not heard from us then please contact us. The fastest way to get your results is to register for My Chart. ° ° °IF you received an x-ray today, you will receive an invoice from [facility]. Please contact [facility] at [phone] with questions or concerns regarding your invoice.  ° °IF you received labwork today, you will receive an invoice from [facility]. Please contact [facility] at [phone] with questions or concerns regarding your invoice.  ° °Our billing staff will not be able to assist you with questions regarding bills from these companies. ° °You will be contacted with the lab results as soon as they are available. The fastest way to get your results is to activate your My Chart account. Instructions are located on the last page of this paperwork. If you have not heard from us regarding the results in 2 weeks, please contact this office. °  ° ° ° °

## 2020-10-06 NOTE — Progress Notes (Signed)
Acute Office Visit  Subjective:    Patient ID: Pamela Gallegos, female    DOB: Jul 09, 1991, 29 y.o.   MRN: [id number]  Chief Complaint  Patient presents with  . vaginal odor    pt has had 2 months where she gets diffrent strong odors notes some very mild discharge reports it is very thin but white, notes no itching or other iritation    HPI Patient is in today for vaginal discharge. Ongoing for a few weeks. Thin white discharge. Foul odor. No new sexual partners, no known sti exposure No pelvic pain, fevers, chills, fatigue  Requesting refills on birth control patch. No issues or concerns  Past Medical History:  Diagnosis Date  . Depression    Phreesia 05/10/2020  . Eczema     No past surgical history on file.  Family History  Problem Relation Age of Onset  . Hyperlipidemia Mother   . Hypertension Mother     Social History   Socioeconomic History  . Marital status: Single    Spouse name: Not on file  . Number of children: Not on file  . Years of education: Not on file  . Highest education level: Not on file  Occupational History  . Not on file  Tobacco Use  . Smoking status: Never Smoker  . Smokeless tobacco: Never Used  Substance and Sexual Activity  . Alcohol use: No  . Drug use: No  . Sexual activity: Never  Other Topics Concern  . Not on file  Social History Narrative  . Not on file   Social Determinants of Health   Financial Resource Strain:   . Difficulty of Paying Living Expenses: Not on file  Food Insecurity:   . Worried About Programme researcher, broadcasting/film/video in the Last Year: Not on file  . Ran Out of Food in the Last Year: Not on file  Transportation Needs:   . Lack of Transportation (Medical): Not on file  . Lack of Transportation (Non-Medical): Not on file  Physical Activity:   . Days of Exercise per Week: Not on file  . Minutes of Exercise per Session: Not on file  Stress:   . Feeling of Stress : Not on file  Social Connections:   .  Frequency of Communication with Friends and Family: Not on file  . Frequency of Social Gatherings with Friends and Family: Not on file  . Attends Religious Services: Not on file  . Active Member of Clubs or Organizations: Not on file  . Attends Banker Meetings: Not on file  . Marital Status: Not on file  Intimate Partner Violence:   . Fear of Current or Ex-Partner: Not on file  . Emotionally Abused: Not on file  . Physically Abused: Not on file  . Sexually Abused: Not on file    Outpatient Medications Prior to Visit  Medication Sig Dispense Refill  . azelastine (OPTIVAR) 0.05 % ophthalmic solution Place 1 drop into the right eye 2 (two) times daily. 6 mL 12  . ibuprofen (ADVIL,MOTRIN) 600 MG tablet Take 1 tablet (600 mg total) by mouth every 8 (eight) hours as needed. 30 tablet 0  . triamcinolone cream (KENALOG) 0.1 % Apply 1 application topically 2 (two) times daily. 30 g 1  . norelgestromin-ethinyl estradiol [facility] [facility]) 150-35 MCG/24HR transdermal patch Place 1 patch onto the skin once a week. 12 patch 3   No facility-administered medications prior to visit.    Allergies  Allergen Reactions  . Penicillins  Review of Systems  Constitutional: Negative.   HENT: Negative.   Eyes: Negative.   Respiratory: Negative.   Cardiovascular: Negative.   Gastrointestinal: Negative.   Genitourinary: Negative.   Musculoskeletal: Negative.   Skin: Negative.   Neurological: Negative.   Psychiatric/Behavioral: Negative.        Objective:    Physical Exam Vitals and nursing note reviewed.  Constitutional:      Appearance: Normal appearance.  Cardiovascular:     Rate and Rhythm: Normal rate and regular rhythm.  Pulmonary:     Effort: Pulmonary effort is normal. No respiratory distress.  Skin:    General: Skin is warm and dry.  Neurological:     General: No focal deficit present.     Mental Status: She is alert and oriented to person, place, and time. Mental status  is at baseline.  Psychiatric:        Mood and Affect: Mood normal.        Behavior: Behavior normal.        Thought Content: Thought content normal.        Judgment: Judgment normal.     BP 115/78   Pulse 73   Temp 98 F (36.7 C) (Temporal)   Resp 15   Ht 5\' 9"  (1.753 m)   Wt 134 lb 12.8 oz (61.1 kg)   SpO2 100%   BMI 19.91 kg/m  Wt Readings from Last 3 Encounters:  10/06/20 134 lb 12.8 oz (61.1 kg)  08/11/20 130 lb 12.8 oz (59.3 kg)  06/16/20 124 lb (56.2 kg)    There are no preventive care reminders to display for this patient.  There are no preventive care reminders to display for this patient.   No results found for: TSH Lab Results  Component Value Date   WBC 3.5 05/12/2020   HGB 13.3 05/12/2020   HCT 39.5 05/12/2020   MCV 94 05/12/2020   PLT 412 05/12/2020   Lab Results  Component Value Date   NA 138 05/12/2020   K 4.2 05/12/2020   CO2 23 05/12/2020   GLUCOSE 68 05/12/2020   BUN 10 05/12/2020   CREATININE 0.75 05/12/2020   BILITOT 0.5 05/12/2020   ALKPHOS 63 05/12/2020   AST 14 05/12/2020   ALT 9 05/12/2020   PROT 7.5 05/12/2020   ALBUMIN 4.3 05/12/2020   CALCIUM 9.7 05/12/2020   No results found for: CHOL No results found for: HDL No results found for: LDLCALC No results found for: TRIG No results found for: CHOLHDL No results found for: 05/14/2020     Assessment & Plan:   Problem List Items Addressed This Visit    None    Visit Diagnoses    Vaginal discharge    -  Primary   Relevant Medications   metroNIDAZOLE (FLAGYL) 500 MG tablet   fluconazole (DIFLUCAN) 150 MG tablet   Other Relevant Orders   POCT Wet + KOH Prep (Completed)   POCT urinalysis dipstick (Completed)   Urine cytology ancillary only   Birth control counseling       Relevant Medications   norelgestromin-ethinyl estradiol IWLN9G) 150-35 MCG/24HR transdermal patch       Meds ordered this encounter  Medications  . norelgestromin-ethinyl estradiol [employer]ico) 150-35  MCG/24HR transdermal patch    Sig: Place 1 patch onto the skin once a week.    Dispense:  12 patch    Refill:  3  . metroNIDAZOLE (FLAGYL) 500 MG tablet    Sig: Take 1 tablet (500  mg total) by mouth 2 (two) times daily.    Dispense:  14 tablet    Refill:  0    Order Specific Question:   Supervising Provider    Answer:   Neva Seat, JEFFREY R [[id number]]  . fluconazole (DIFLUCAN) 150 MG tablet    Sig: Take 1 tablet (150 mg total) by mouth once for 1 dose.    Dispense:  1 tablet    Refill:  0    Order Specific Question:   Supervising Provider    Answer:   Neva Seat, JEFFREY R [[id number]]   PLAN  Metronidazole 500mg  Po bid for one week  Diflucan for recent hx of persistent candidal infection  Sending out STI testing  Patient encouraged to call clinic with any questions, comments, or concerns.  , NP

## 2020-10-09 LAB — URINE CYTOLOGY ANCILLARY ONLY
Chlamydia: NEGATIVE
Comment: NEGATIVE
Comment: NEGATIVE
Comment: NORMAL
Neisseria Gonorrhea: NEGATIVE
Trichomonas: NEGATIVE

## 2020-10-19 IMAGING — CR DG FINGER MIDDLE 2+V*L*
3 series · 3 of 3 positions shown · non-contrast
Comparison: None.

CLINICAL DATA: Finger laceration

EXAM:
LEFT MIDDLE FINGER 2+V

[x finger pa left]
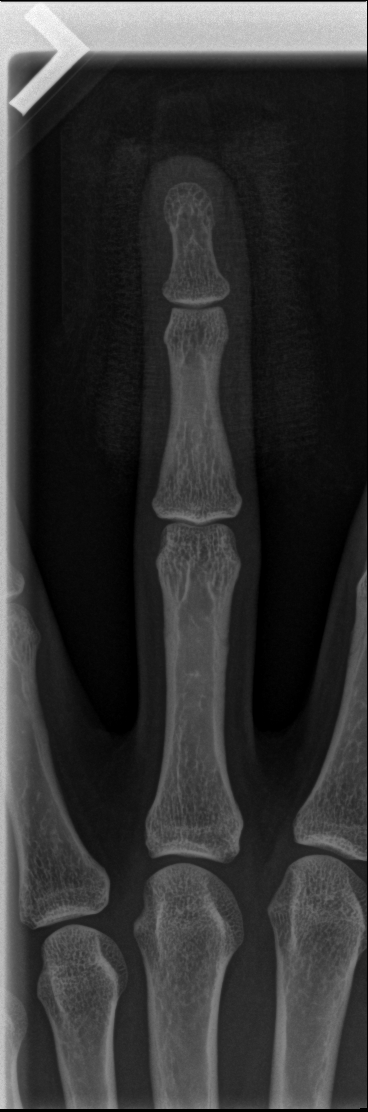

[x finger obl left]
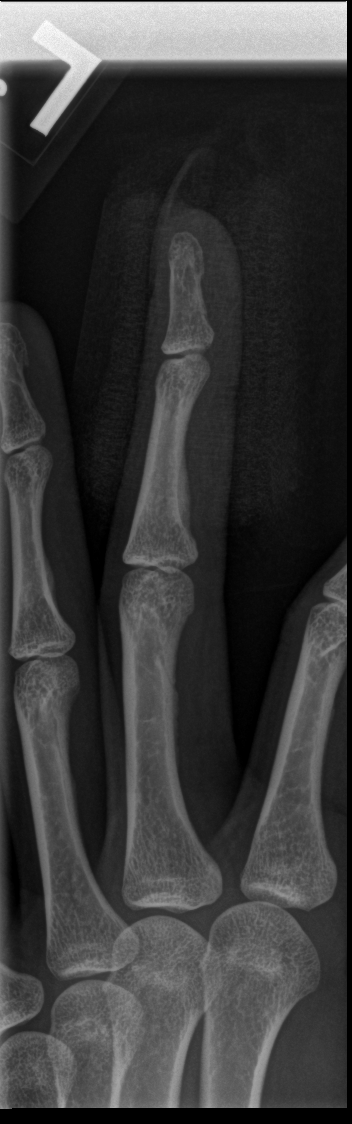

[x finger lat left]
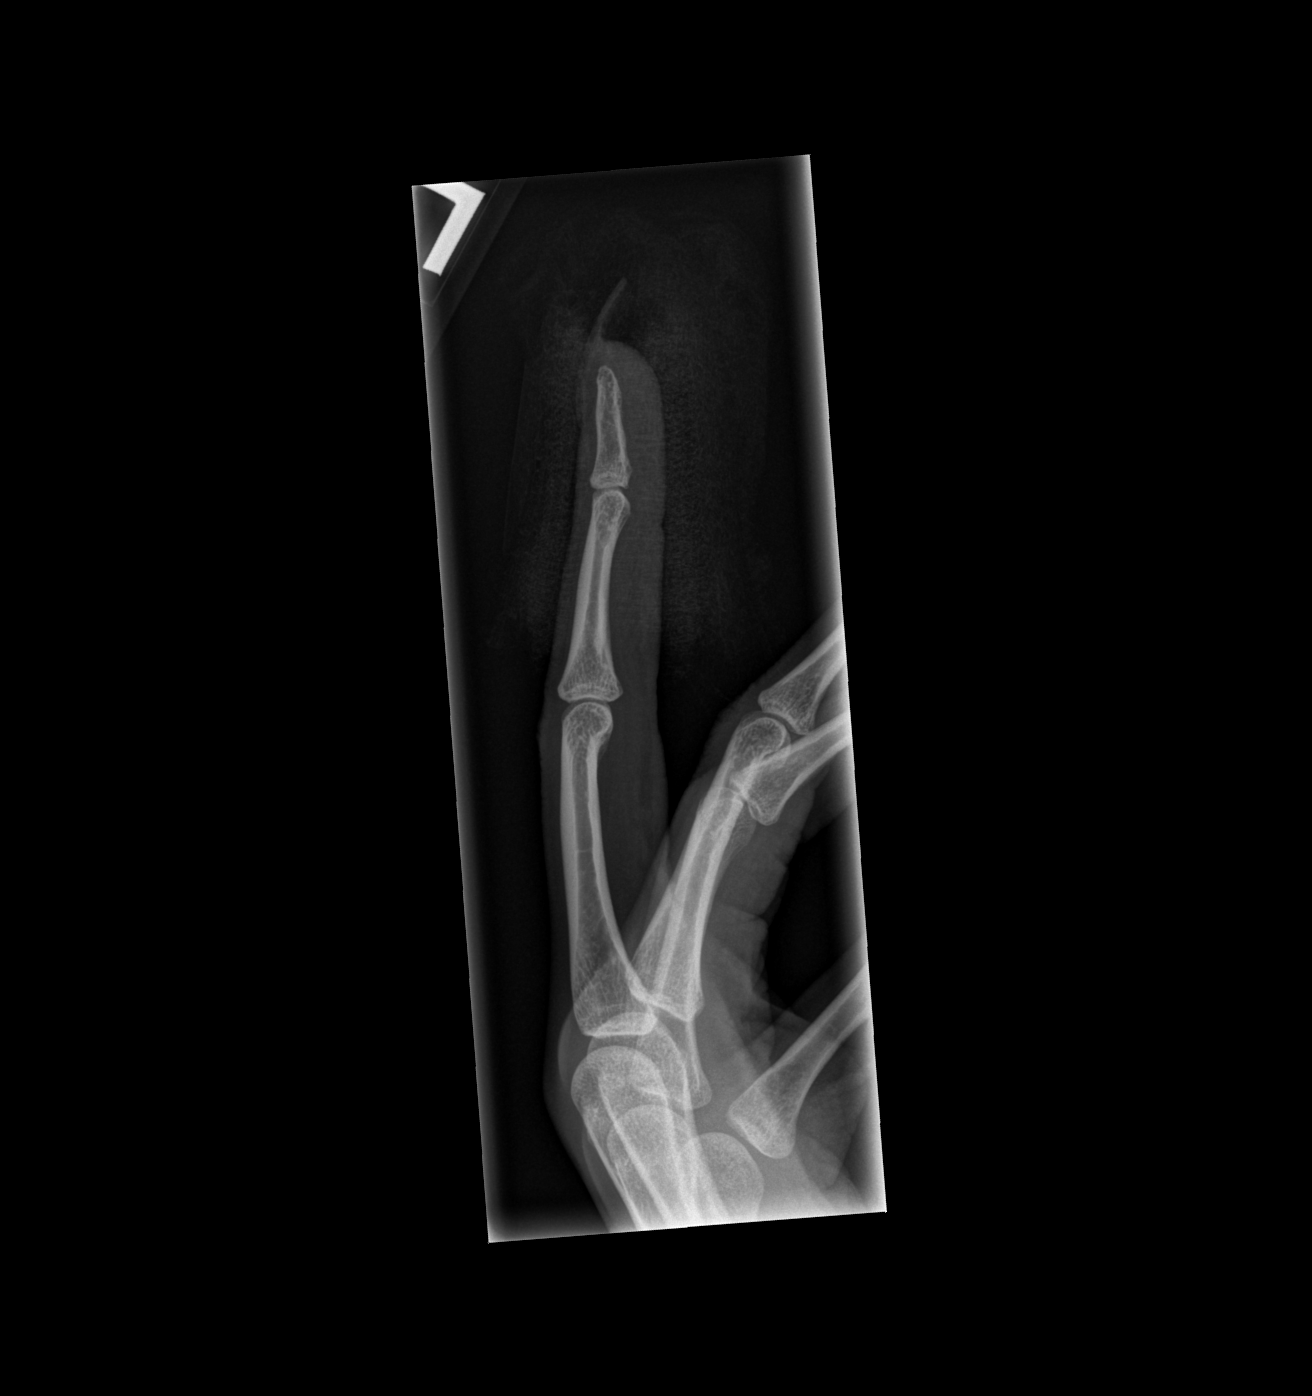

[3 of 3 positions shown; findings below may reference images not displayed]

FINDINGS: There is no evidence of fracture or dislocation. There is no
evidence of arthropathy or other focal bone abnormality. Soft
tissues are unremarkable.
IMPRESSION: Negative.

## 2020-10-30 ENCOUNTER — Other Ambulatory Visit: Payer: Self-pay | Admitting: Registered Nurse

## 2020-10-30 DIAGNOSIS — Z3009 Encounter for other general counseling and advice on contraception: Secondary | ICD-10-CM

## 2020-10-30 MED ORDER — XULANE 150-35 MCG/24HR TD PTWK: 1.0000 | MEDICATED_PATCH | TRANSDERMAL | 0 refills | Status: AC

## 2020-10-30 NOTE — Telephone Encounter (Signed)
Medication Refill - Medication: norelgestromin-ethinyl estradiol [employer]r [employer]ico) 150-35 MCG/24HR transdermal patch  Pt is on her last patch ad is due for a refill next Tuesday but is leaving to go out of town this Wednesday and wanted to get this refilled early to pick up and have available while out of town / please advise asap   Has the patient contacted their pharmacy? No. (Agent: If no, request that the patient contact the pharmacy for the refill.) (Agent: If yes, when and what did the pharmacy advise?)  Preferred Pharmacy (with phone number or street name): [facility] [address], [place] - [address]  [address] [place], [place] [address]  Phone:  [phone] Fax:  [phone]   Agent: Please be advised that RX refills may take up to 3 business days. We ask that you follow-up with your pharmacy.

## 2020-10-30 NOTE — Telephone Encounter (Signed)
Call to pharmacy x3- had to leave message on pharmacy VM- allow patient to pick up early- will be out of town for 2 weeks- called patient and she is aware of contact with pharmacy- she will call them to see if she can pick up tomorrow which will be 1 week prior to RF date. Alternative would be seeing if there is a Development worker, international aid in area of travel and getting Rx from them. Patient will call back if needed.
